# Patient Record
Sex: Female | Born: 1945 | Race: White | Hispanic: No | Marital: Married | Smoking: Current every day smoker
Health system: Southern US, Community
[De-identification: ages and names within clinical notes are randomized; demographics above are authoritative.]

## PROBLEM LIST (undated history)

## (undated) DIAGNOSIS — E119 Type 2 diabetes mellitus without complications: Secondary | ICD-10-CM

## (undated) DIAGNOSIS — E7800 Pure hypercholesterolemia, unspecified: Secondary | ICD-10-CM

## (undated) DIAGNOSIS — I10 Essential (primary) hypertension: Secondary | ICD-10-CM

## (undated) DIAGNOSIS — E079 Disorder of thyroid, unspecified: Secondary | ICD-10-CM

## (undated) DIAGNOSIS — K602 Anal fissure, unspecified: Secondary | ICD-10-CM

## (undated) HISTORY — DX: Disorder of thyroid, unspecified: E07.9

## (undated) HISTORY — DX: Anal fissure, unspecified: K60.2

## (undated) HISTORY — DX: Pure hypercholesterolemia, unspecified: E78.00

## (undated) HISTORY — PX: COLONOSCOPY: SHX174

---

## 1966-02-10 HISTORY — PX: ANAL FISTULOTOMY: SHX6423

## 2000-01-29 ENCOUNTER — Encounter: Payer: Self-pay | Admitting: Emergency Medicine

## 2000-01-29 ENCOUNTER — Inpatient Hospital Stay (HOSPITAL_COMMUNITY): Admission: EM | Admit: 2000-01-29 | Discharge: 2000-01-30 | Payer: Self-pay | Admitting: Emergency Medicine

## 2004-01-13 ENCOUNTER — Emergency Department (HOSPITAL_COMMUNITY): Admission: EM | Admit: 2004-01-13 | Discharge: 2004-01-13 | Payer: Self-pay | Admitting: Emergency Medicine

## 2006-01-03 IMAGING — CR DG THORACIC SPINE 2V
3 series · 3 of 3 positions shown · non-contrast
Comparison: none

CLINICAL DATA: Fall, upper back pain.
 THORACIC SPINE SERIES ? 01/13/04: 
 The thoracic spine is kyphotic.  The bones are demineralized.  There are multilevel degenerative changes.  No fracture or subluxation is seen.

[view not recorded (1 of 3)]
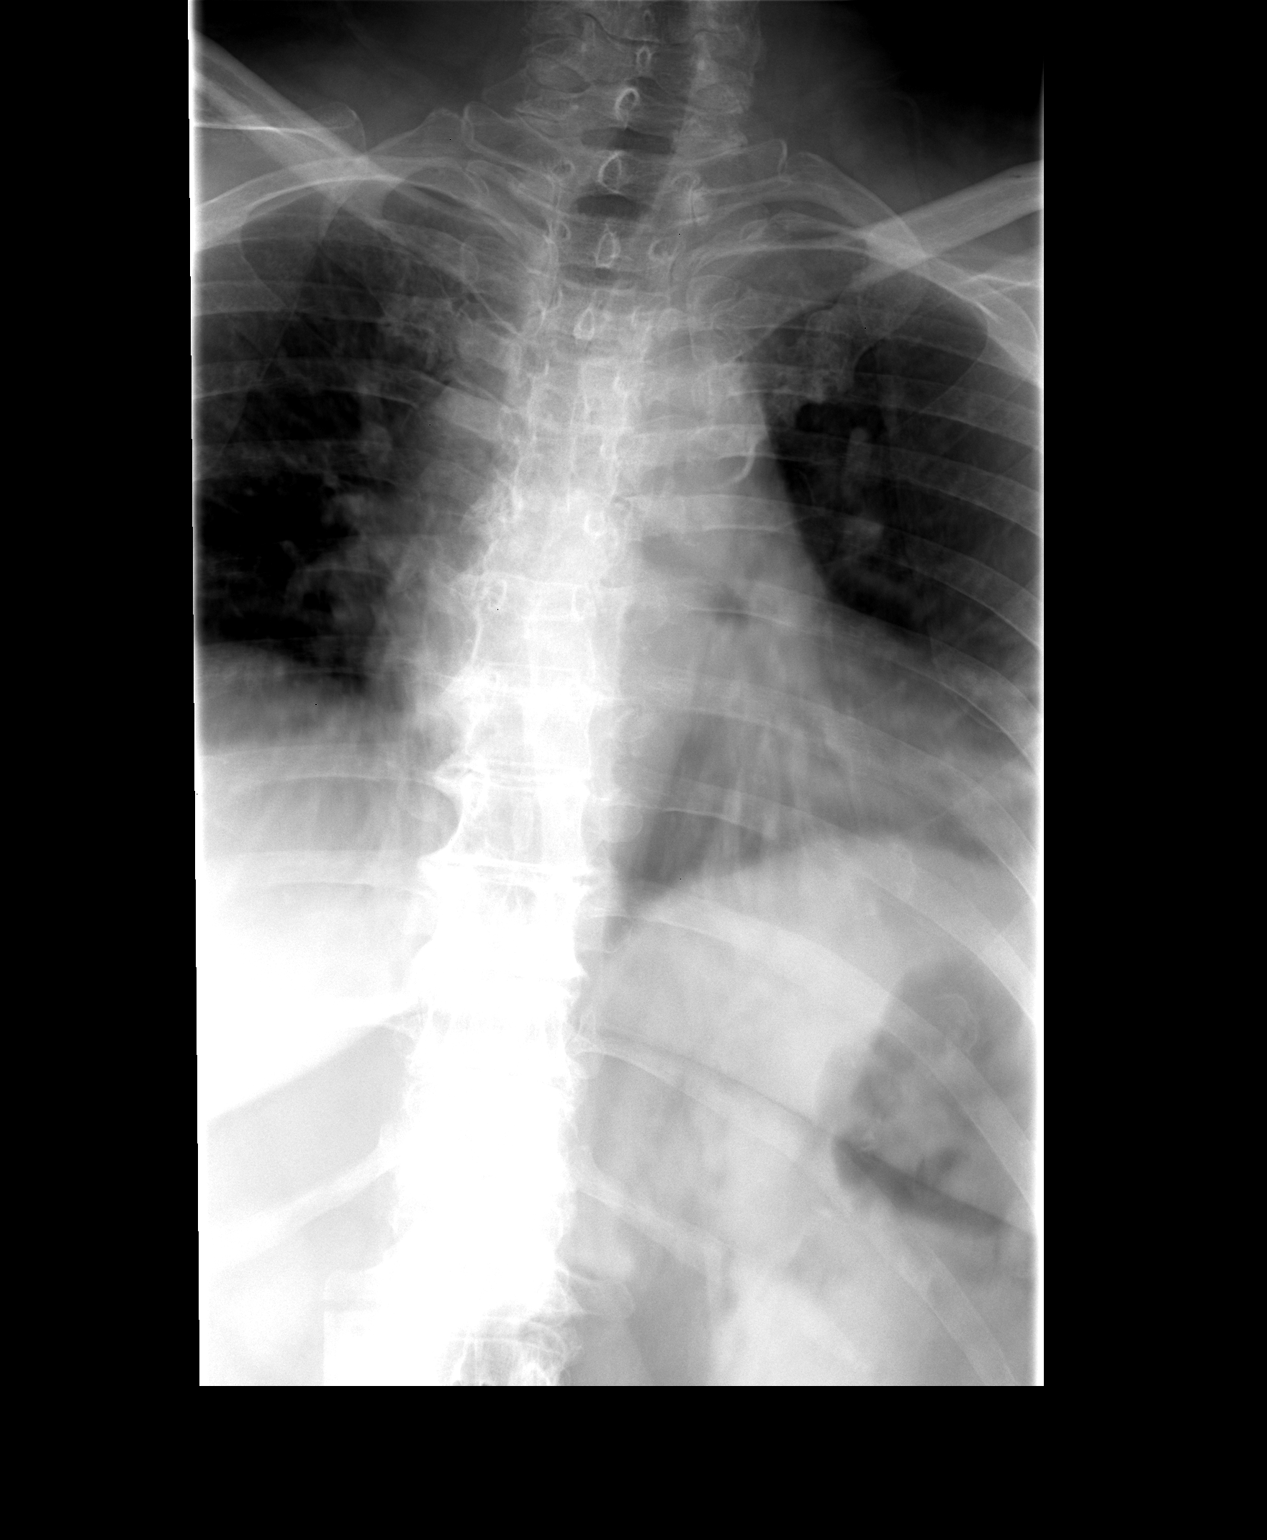

[view not recorded (2 of 3)]
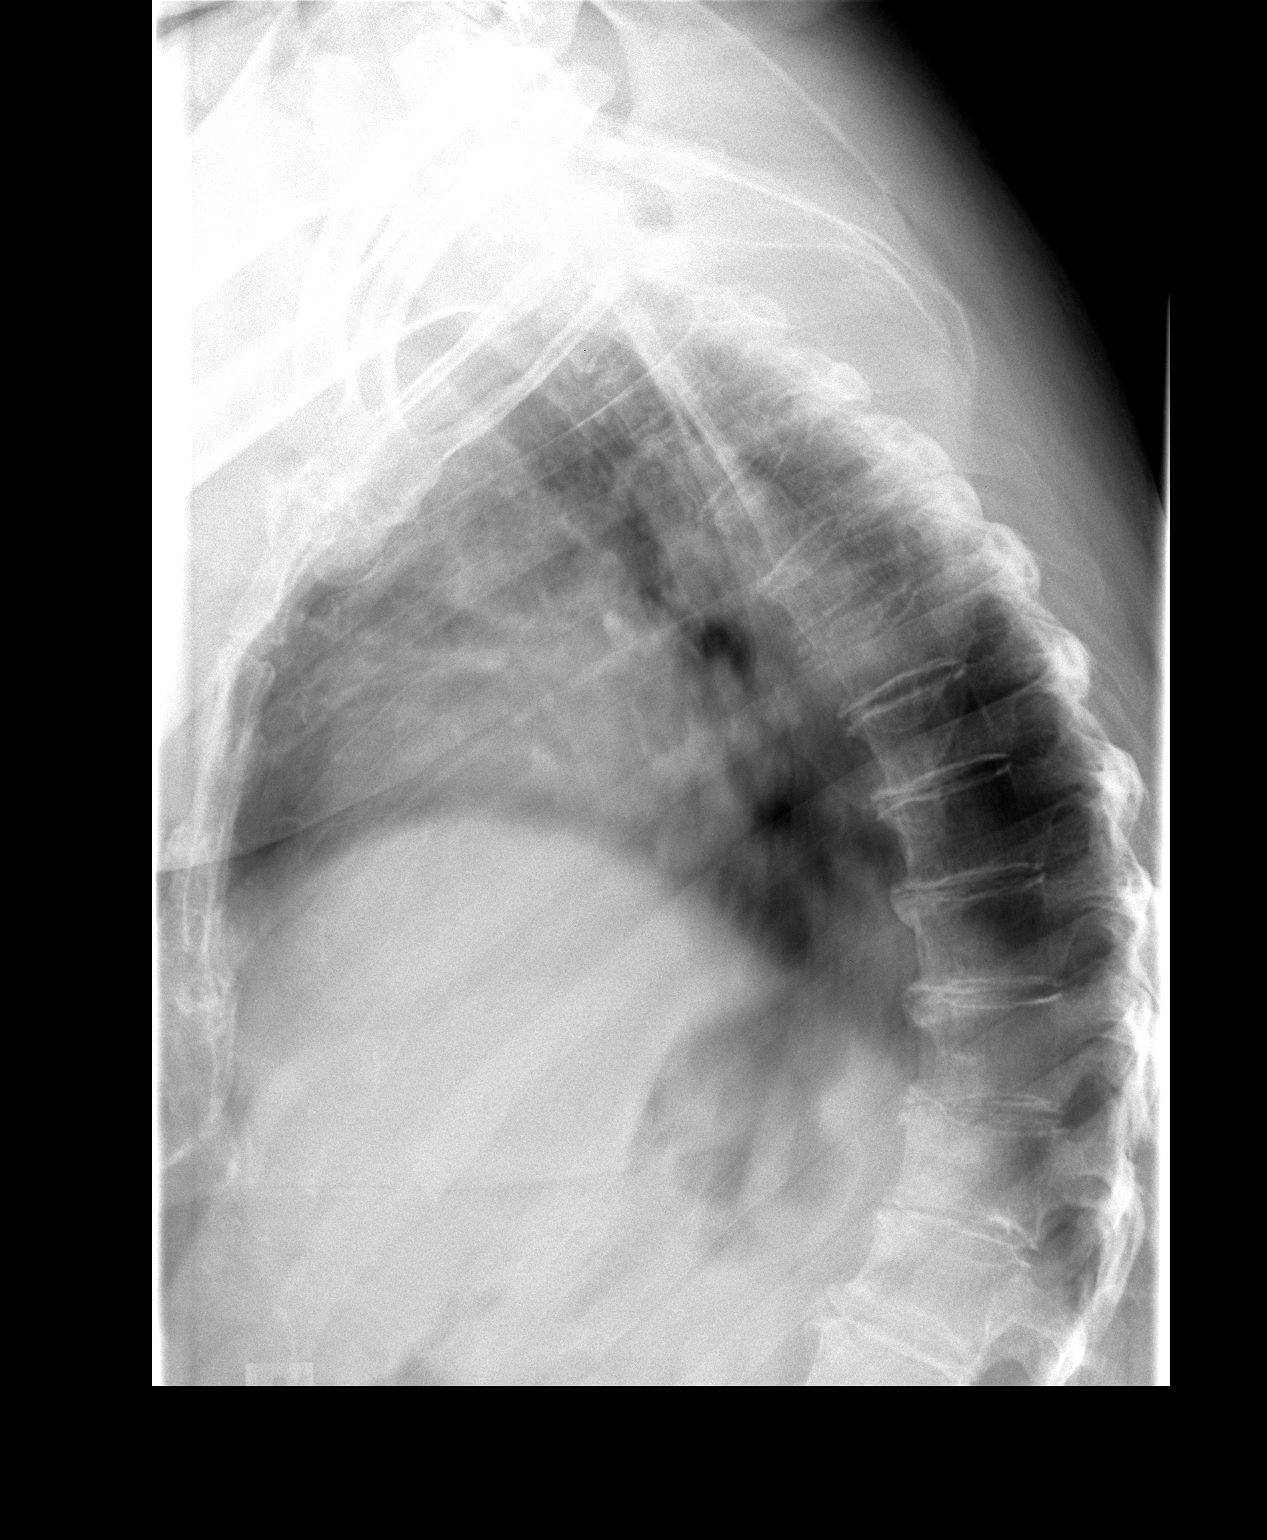

[view not recorded (3 of 3)]
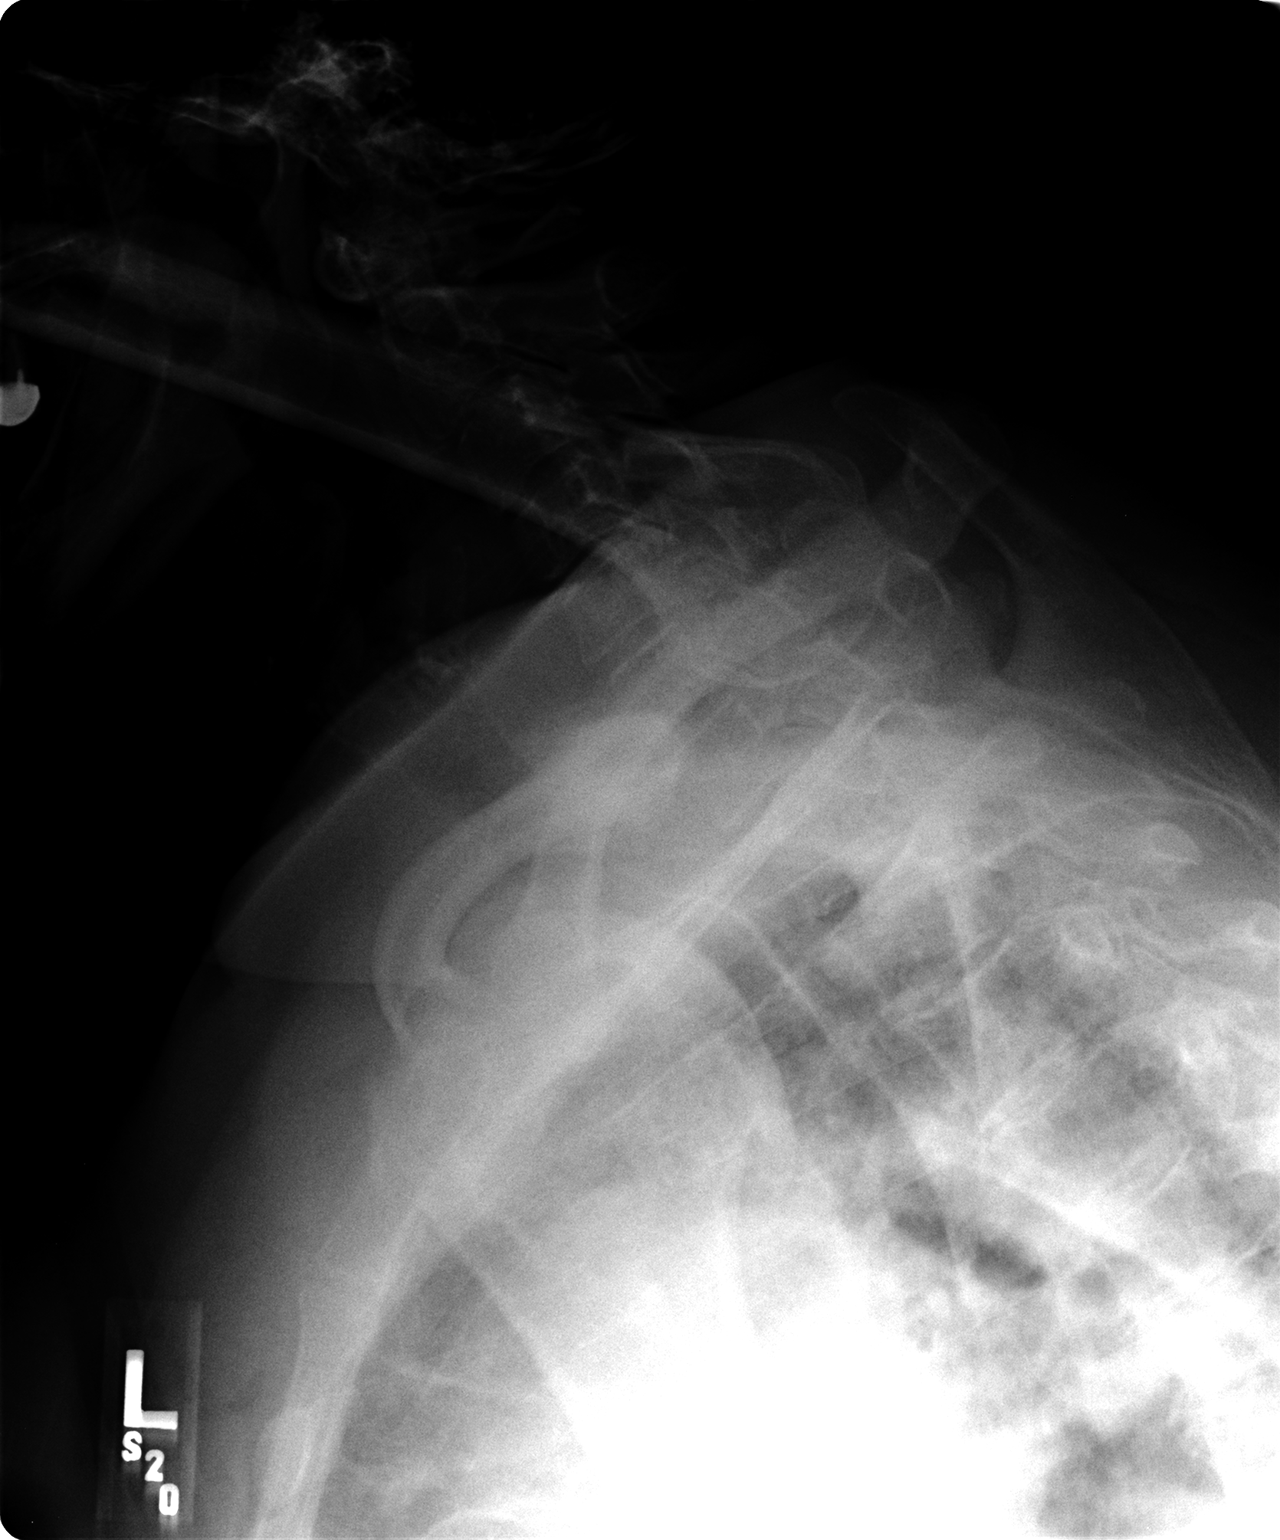

[3 of 3 positions shown; findings below may reference images not displayed]

IMPRESSION: 1.  No convincing evidence for an acute fracture.
 2.  Degenerative changes, thoracic kyphosis, demineralization.

## 2013-02-10 HISTORY — PX: FIXATION KYPHOPLASTY LUMBAR SPINE: SHX1642

## 2015-02-11 HISTORY — PX: CORONARY ARTERY BYPASS GRAFT: SHX141

## 2015-06-28 ENCOUNTER — Other Ambulatory Visit: Payer: Self-pay | Admitting: Physical Medicine and Rehabilitation

## 2015-06-28 DIAGNOSIS — M5136 Other intervertebral disc degeneration, lumbar region: Secondary | ICD-10-CM

## 2015-06-30 ENCOUNTER — Encounter (HOSPITAL_COMMUNITY): Payer: Self-pay

## 2015-06-30 ENCOUNTER — Inpatient Hospital Stay (HOSPITAL_COMMUNITY)
Admission: EM | Admit: 2015-06-30 | Discharge: 2015-07-02 | DRG: 392 | Disposition: A | Discharge status: Home / Self Care | Payer: Medicare Other | Attending: Internal Medicine | Admitting: Internal Medicine

## 2015-06-30 ENCOUNTER — Emergency Department (HOSPITAL_COMMUNITY): Payer: Medicare Other

## 2015-06-30 DIAGNOSIS — N179 Acute kidney failure, unspecified: Secondary | ICD-10-CM | POA: Diagnosis not present

## 2015-06-30 DIAGNOSIS — Z7984 Long term (current) use of oral hypoglycemic drugs: Secondary | ICD-10-CM

## 2015-06-30 DIAGNOSIS — Z6835 Body mass index (BMI) 35.0-35.9, adult: Secondary | ICD-10-CM | POA: Diagnosis not present

## 2015-06-30 DIAGNOSIS — E669 Obesity, unspecified: Secondary | ICD-10-CM | POA: Diagnosis present

## 2015-06-30 DIAGNOSIS — K5732 Diverticulitis of large intestine without perforation or abscess without bleeding: Secondary | ICD-10-CM | POA: Diagnosis present

## 2015-06-30 DIAGNOSIS — I10 Essential (primary) hypertension: Secondary | ICD-10-CM | POA: Diagnosis present

## 2015-06-30 DIAGNOSIS — Z79899 Other long term (current) drug therapy: Secondary | ICD-10-CM | POA: Diagnosis not present

## 2015-06-30 DIAGNOSIS — I959 Hypotension, unspecified: Secondary | ICD-10-CM | POA: Diagnosis present

## 2015-06-30 DIAGNOSIS — K5792 Diverticulitis of intestine, part unspecified, without perforation or abscess without bleeding: Secondary | ICD-10-CM | POA: Diagnosis present

## 2015-06-30 DIAGNOSIS — E119 Type 2 diabetes mellitus without complications: Secondary | ICD-10-CM | POA: Diagnosis present

## 2015-06-30 DIAGNOSIS — R1031 Right lower quadrant pain: Secondary | ICD-10-CM | POA: Diagnosis present

## 2015-06-30 HISTORY — DX: Type 2 diabetes mellitus without complications: E11.9

## 2015-06-30 HISTORY — DX: Essential (primary) hypertension: I10

## 2015-06-30 LAB — COMPREHENSIVE METABOLIC PANEL WITH GFR
ALT: 20 U/L (ref 14–54)
AST: 21 U/L (ref 15–41)
Albumin: 4 g/dL (ref 3.5–5.0)
Alkaline Phosphatase: 76 U/L (ref 38–126)
Anion gap: 15 (ref 5–15)
BUN: 23 mg/dL — ABNORMAL HIGH (ref 6–20)
CO2: 28 mmol/L (ref 22–32)
Calcium: 9.7 mg/dL (ref 8.9–10.3)
Chloride: 95 mmol/L — ABNORMAL LOW (ref 101–111)
Creatinine, Ser: 1.33 mg/dL — ABNORMAL HIGH (ref 0.44–1.00)
GFR calc Af Amer: 46 mL/min — ABNORMAL LOW
GFR calc non Af Amer: 40 mL/min — ABNORMAL LOW
Glucose, Bld: 140 mg/dL — ABNORMAL HIGH (ref 65–99)
Potassium: 3.9 mmol/L (ref 3.5–5.1)
Sodium: 138 mmol/L (ref 135–145)
Total Bilirubin: 0.8 mg/dL (ref 0.3–1.2)
Total Protein: 8.4 g/dL — ABNORMAL HIGH (ref 6.5–8.1)

## 2015-06-30 LAB — CBC
HCT: 42.7 % (ref 36.0–46.0)
Hemoglobin: 14.2 g/dL (ref 12.0–15.0)
MCH: 30.5 pg (ref 26.0–34.0)
MCHC: 33.3 g/dL (ref 30.0–36.0)
MCV: 91.6 fL (ref 78.0–100.0)
Platelets: 339 K/uL (ref 150–400)
RBC: 4.66 MIL/uL (ref 3.87–5.11)
RDW: 13.2 % (ref 11.5–15.5)
WBC: 16.7 K/uL — ABNORMAL HIGH (ref 4.0–10.5)

## 2015-06-30 LAB — GLUCOSE, CAPILLARY
Glucose-Capillary: 84 mg/dL (ref 65–99)
Glucose-Capillary: 82 mg/dL (ref 65–99)

## 2015-06-30 LAB — LIPASE, BLOOD: Lipase: 22 U/L (ref 11–51)

## 2015-06-30 MED ORDER — METRONIDAZOLE IN NACL 5-0.79 MG/ML-% IV SOLN: 500.0000 mg | Freq: Once | INTRAVENOUS | Status: DC

## 2015-06-30 MED ORDER — IOPAMIDOL (ISOVUE-300) INJECTION 61%
INTRAVENOUS | Status: AC
  Administered 2015-06-30: 75 mL
  Filled 2015-06-30: qty 75

## 2015-06-30 MED ORDER — ENOXAPARIN SODIUM 40 MG/0.4ML ~~LOC~~ SOLN
40.0000 mg | SUBCUTANEOUS | Status: DC
  Administered 2015-06-30 – 2015-07-01 (×2): 40 mg via SUBCUTANEOUS
  Filled 2015-06-30 (×2): qty 0.4

## 2015-06-30 MED ORDER — INSULIN ASPART 100 UNIT/ML ~~LOC~~ SOLN: 0.0000 [IU] | SUBCUTANEOUS | Status: DC

## 2015-06-30 MED ORDER — SODIUM CHLORIDE 0.9 % IV BOLUS (SEPSIS)
1000.0000 mL | Freq: Once | INTRAVENOUS | Status: AC
  Administered 2015-06-30: 1000 mL via INTRAVENOUS

## 2015-06-30 MED ORDER — ONDANSETRON HCL 4 MG PO TABS: 4.0000 mg | ORAL_TABLET | Freq: Four times a day (QID) | ORAL | Status: DC | PRN

## 2015-06-30 MED ORDER — CIPROFLOXACIN IN D5W 400 MG/200ML IV SOLN
400.0000 mg | Freq: Once | INTRAVENOUS | Status: AC
  Administered 2015-06-30: 400 mg via INTRAVENOUS
  Filled 2015-06-30: qty 200

## 2015-06-30 MED ORDER — ONDANSETRON HCL 4 MG/2ML IJ SOLN: 4.0000 mg | Freq: Four times a day (QID) | INTRAMUSCULAR | Status: DC | PRN

## 2015-06-30 MED ORDER — FENTANYL CITRATE (PF) 100 MCG/2ML IJ SOLN
50.0000 ug | Freq: Once | INTRAMUSCULAR | Status: AC
  Administered 2015-06-30: 50 ug via INTRAVENOUS
  Filled 2015-06-30: qty 2

## 2015-06-30 MED ORDER — ONDANSETRON HCL 4 MG/2ML IJ SOLN
4.0000 mg | Freq: Once | INTRAMUSCULAR | Status: AC
  Administered 2015-06-30: 4 mg via INTRAVENOUS
  Filled 2015-06-30: qty 2

## 2015-06-30 MED ORDER — METRONIDAZOLE IN NACL 5-0.79 MG/ML-% IV SOLN
500.0000 mg | Freq: Three times a day (TID) | INTRAVENOUS | Status: DC
  Administered 2015-06-30 – 2015-07-02 (×6): 500 mg via INTRAVENOUS
  Filled 2015-06-30 (×9): qty 100

## 2015-06-30 MED ORDER — CIPROFLOXACIN IN D5W 400 MG/200ML IV SOLN
400.0000 mg | Freq: Two times a day (BID) | INTRAVENOUS | Status: DC
  Administered 2015-07-01 – 2015-07-02 (×3): 400 mg via INTRAVENOUS
  Filled 2015-06-30 (×4): qty 200

## 2015-06-30 MED ORDER — POTASSIUM CHLORIDE IN NACL 20-0.9 MEQ/L-% IV SOLN
INTRAVENOUS | Status: DC
  Administered 2015-06-30 – 2015-07-01 (×2): via INTRAVENOUS
  Filled 2015-06-30 (×3): qty 1000

## 2015-06-30 MED ORDER — MORPHINE SULFATE (PF) 2 MG/ML IV SOLN: 1.0000 mg | INTRAVENOUS | Status: DC | PRN

## 2015-06-30 NOTE — H&P (Signed)
Date: 06/30/2015               Patient Name:  Kimberly Bullock MRN: [id number]  DOB: 1945-07-28 Age / Sex: 70 y.o., female   PCP: Hart Carwin, MD         Medical Service: Internal Medicine Teaching Service         Attending Physician: Dr. Tyson Alias, MD    First Contact: Dr. Reubin Milan Pager: [phone]  Second Contact: Dr. Griffin Basil Pager: [phone]       After Hours (After 5p/  First Contact Pager: [phone]  weekends / holidays): Second Contact Pager: [phone]   Chief Complaint: Abdominal pain  History of Present Illness: [name] is a 70 year old with T2DM and HTN who presents with RLQ abdominal pain for the past 3 days. When she was following up at her orthopedic surgeon's office 3 days ago when she started noticing sharp right lower quadrant abdominal pain that would sometimes move to the midline. She had no other symptoms at that time. She does have a history of constipation, so she tried milk of magnesia which improved her pain temporarily, before coming back. After taking the milk of magnesia, she said initially her bowel movements were a "brick" color without obvious blood, but her bowel movements appear normal now. Nothing makes it worse. 2 days ago, she started having subjective fevers at night as well as some mild burning with urination. She denies any nausea or vomiting, abdominal distention, worsening constipation or diarrhea, any urinary frequency or urgency, chest pain, shortness of breath, or palpitations. She denies any recent illness or sick contacts, or any questionable food ingestion. She has never had these symptoms before.   Meds: Current Facility-Administered Medications  Medication Dose Route Frequency Provider Last Rate Last Dose  . 0.9 % NaCl with KCl 20 mEq/ L  infusion   Intravenous Continuous Lora Paula, MD      . ciprofloxacin (CIPRO) IVPB 400 mg  400 mg Intravenous Once Gerhard Munch, MD 200 mL/hr at 06/30/15 1436 400 mg at  06/30/15 1436  . enoxaparin (LOVENOX) injection 40 mg  40 mg Subcutaneous Q24H Lora Paula, MD      . metroNIDAZOLE (FLAGYL) IVPB 500 mg  500 mg Intravenous Once Gerhard Munch, MD      . morphine 2 MG/ML injection 1 mg  1 mg Intravenous Q4H PRN Lora Paula, MD      . ondansetron [facility]) tablet 4 mg  4 mg Oral Q6H PRN Lora Paula, MD       Or  . ondansetron [facility]) injection 4 mg  4 mg Intravenous Q6H PRN Lora Paula, MD       No current outpatient prescriptions on file.    Allergies: Allergies as of 06/30/2015  . (Not on File)   Past Medical History  Diagnosis Date  . Diabetes mellitus without complication (HCC)   . Hypertension    History reviewed. No pertinent past surgical history. No family history on file. Social History   Social History  . Marital Status: Married    Spouse Name: N/A  . Number of Children: N/A  . Years of Education: N/A   Occupational History  . Not on file.   Social History Main Topics  . Smoking status: Never Smoker   . Smokeless tobacco: Not on file  . Alcohol Use: Not on file  . Drug Use: Not on file  . Sexual Activity: Not on file  Other Topics Concern  . Not on file   Social History Narrative  . No narrative on file    Review of Systems: Pertinent items noted in HPI and remainder of comprehensive ROS otherwise negative.  Physical Exam: Blood pressure 113/61, pulse 65, temperature 98.7 F (37.1 C), temperature source Oral, resp. rate 18, weight 176 lb 5.9 oz (80 kg), SpO2 98 %.   Gen: Well-appearing, alert and oriented to person, place, and time  HEENT: Oropharynx clear without erythema or exudate.  Neck: No cervical LAD, no thyromegaly or nodules, no JVD noted. CV: Normal rate, regular rhythm, no murmurs, rubs, or gallops Pulmonary: Normal effort, CTA bilaterally, no crackles or wheezes Abdominal: Soft, obese abdomen, mild tenderness to palpation in the RLQ and suprapubic region, without rebound, guarding,  or hepatosplenomegaly. Murphy's negative, Rovsing's negative. Bowel sounds heard. Extremities: Distal pulses 2+ in upper and lower extremities bilaterally, no tenderness, erythema or edema Neuro: CN II-XII grossly intact, no focal weakness or sensory deficits noted Skin: No atypical appearing moles. No rashes  Lab results: Basic Metabolic Panel:  Recent Labs  81/19/1405/20/17 1022  NA 138  K 3.9  CL 95*  CO2 28  GLUCOSE 140*  BUN 23*  CREATININE 1.33*  CALCIUM 9.7   Liver Function Tests:  Recent Labs  06/30/15 1022  AST 21  ALT 20  ALKPHOS 76  BILITOT 0.8  PROT 8.4*  ALBUMIN 4.0    Recent Labs  06/30/15 1022  LIPASE 22   CBC:  Recent Labs  06/30/15 1022  WBC 16.7*  HGB 14.2  HCT 42.7  MCV 91.6  PLT 339   Imaging results:  Ct Abdomen Pelvis W Contrast  06/30/2015  CLINICAL DATA:  Right lower quadrant pain x3 days EXAM: CT ABDOMEN AND PELVIS WITH CONTRAST TECHNIQUE: Multidetector CT imaging of the abdomen and pelvis was performed using the standard protocol following bolus administration of intravenous contrast. CONTRAST:  75 mL Isovue 300 IV COMPARISON:  Correlation with prior CT abdomen/ pelvis report dated 01/29/2000. Those images are not available at the time of dictation. FINDINGS: Lower chest: Mild patchy bilateral lower lobe opacities, likely atelectasis. Additional linear/plate-like scarring/atelectasis in the right middle lobe, lingula, and bilateral lower lobes. Cardiomegaly.  Coronary atherosclerosis. Hepatobiliary: Liver is within normal limits. No suspicious/enhancing hepatic lesions. Gallbladder is unremarkable. Mild central intrahepatic ductal prominence. Dilated common duct, measuring 19 mm, although smoothly tapering at the ampulla. By report, this measured 12 mm in 2001. Pancreas: Within normal limits. No associated pancreatic ductal dilatation (double duct sign), mass, or atrophy. Spleen: Within normal limits. Adrenals/Urinary Tract: Adrenal glands are  within normal limits. 5 mm probable cyst in the right lower kidney (series 2/ image 42). No hydronephrosis. Bladder is within normal limits. Stomach/Bowel: Stomach is within normal limits. No evidence of bowel obstruction. Normal appendix (series 2/ image 39). Diverticulosis involving the distal descending and proximal sigmoid colon. Associated mild inflammatory changes along the proximal sigmoid colon in the left lower quadrant (series 2/ image 81), suggesting mild sigmoid diverticulitis (series 2/ image 81). No drainable fluid collection/ abscess.  No free air. Vascular/Lymphatic: Atherosclerotic calcifications of the abdominal aorta and branch vessels. No evidence of abdominal aortic aneurysm. No suspicious abdominopelvic lymphadenopathy. Reproductive: Uterus is grossly unremarkable. Bilateral ovaries are within normal limits. Other: No abdominopelvic ascites. No drainable fluid collection/ abscess. No free air to suggest macroscopic perforation. Musculoskeletal: Moderate compression fracture deformity at L3 with prior vertebral augmentation. Vertebroplasty cement in the spinal canal. Degenerative changes of the visualized  thoracolumbar spine. IMPRESSION: Sigmoid diverticulitis. No drainable fluid collection/ abscess. No free air to suggest macroscopic perforation. Dilated common duct, measuring 19 mm, although smoothly tapering at the ampulla. By report, this measured 12 mm in 2001, indicating a chronic process. In the setting of normal LFTs, this is of questionable clinical significance. No evidence of bowel obstruction.  Normal appendix. Additional ancillary findings as above. Electronically Signed   By: Charline Bills M.D.   On: 06/30/2015 14:04   Assessment & Plan by Problem: 1. Acute diverticulitis-patient with 3 days of right lower quadrant and low midline abdominal pain. Also with subjective fevers and possible dysuria for the past 2 days. Patient afebrile here, initially with some hypotension in  the mid 80s systolic that has resolved with IV hydration, not tachycardic. Mild tenderness in the mid and right lower abdomen without peritoneal signs. WBC 17, mild creatinine elevation at 1.33 (no baseline) without other lab abnormalities including a normal lipase. CT abdomen pelvis showing sigmoid diverticulitis without signs of complications such as abscess or perforation. While her abdominal exam is somewhat unexpected for diverticulitis, there are no other obvious signs to suggest a separate process at this time, with dysuria possibly being related to diverticular inflammation itself versus an early UTI. -IV metronidazole and ciprofloxacin -Nothing by mouth for now; advance diet as tolerated -Morphine 1 mg every 4 hours as needed for severe pain -Ondansetron 4 mg every 6 hours when necessary for nausea or vomiting -IV NS with KCl 20 at 100 mL's per hour -Follow-up BMP, CBC, UA with reflex microscopy  2. T2 DM - no A1c in chart. patient unsure of current medication, takes one oral medication -Obtain medication list from husband. -SSI  3. HTN - patient on 2 oral agents at home, unsure of names -Hold BP meds, obtain list as above  Dispo: Disposition is deferred at this time, awaiting improvement of current medical problems. Anticipated discharge in approximately 2-3 day(s).   The patient does have a current PCP Hart Carwin, MD) and does need an [facility] hospital follow-up appointment after discharge.  The patient does not have transportation limitations that hinder transportation to clinic appointments.  Signed: Darrick Huntsman, MD 06/30/2015, 3:29 PM

## 2015-06-30 NOTE — ED Notes (Signed)
Patient here with RLQ abdominal pain x 1 week. Patient felt full and has been taking milk of magnesia and last BM this am. No nausea, no vomiting. Pain worse with movement

## 2015-06-30 NOTE — ED Provider Notes (Addendum)
CSN: [id number]     Arrival date & time 06/30/15  1008 History   First MD Initiated Contact with Patient 06/30/15 1110     Chief Complaint  Patient presents with  . Abdominal Pain   HPI  Patient presents w one week of weakness, anorexia and abd pain.  Pain is sore, moderate, most prominent in the RUQ, but diffuse. There has been no vomiting, though she is nauseous.  No BM changes.  No clear alleviating or exacerbating factors.  Past Medical History  Diagnosis Date  . Diabetes mellitus without complication (HCC)   . Hypertension    History reviewed. No pertinent past surgical history. No family history on file. Social History  Substance Use Topics  . Smoking status: Never Smoker   . Smokeless tobacco: None  . Alcohol Use: None   OB History    No data available     Review of Systems  Constitutional:       Per HPI, otherwise negative  HENT:       Per HPI, otherwise negative  Respiratory:       Per HPI, otherwise negative  Cardiovascular:       Per HPI, otherwise negative  Gastrointestinal: Positive for nausea and abdominal pain. Negative for vomiting.  Endocrine:       Negative aside from HPI  Genitourinary:       Neg aside from HPI   Musculoskeletal:       Per HPI, otherwise negative  Skin: Negative.   Neurological: Negative for syncope.      Allergies  Review of patient's allergies indicates not on file.  Home Medications   Prior to Admission medications   Not on File   BP 99/59 mmHg  Pulse 65  Temp(Src) 98.7 F (37.1 C) (Oral)  Resp 18  Wt 176 lb 5.9 oz (80 kg)  SpO2 96% Physical Exam  Constitutional: She is oriented to person, place, and time. She appears well-developed and well-nourished. No distress.  HENT:  Head: Normocephalic and atraumatic.  Eyes: Conjunctivae and EOM are normal.  Cardiovascular: Normal rate and regular rhythm.   Pulmonary/Chest: Effort normal and breath sounds normal. No stridor. No respiratory distress.  Abdominal: She  exhibits no distension. There is tenderness.  Mild generalized discomfort  Musculoskeletal: She exhibits no edema.  Neurological: She is alert and oriented to person, place, and time. No cranial nerve deficit.  Skin: Skin is warm and dry.  Psychiatric: She has a normal mood and affect.  Nursing note and vitals reviewed.   ED Course  Procedures (including critical care time) Labs Review Labs Reviewed  COMPREHENSIVE METABOLIC PANEL - Abnormal; Notable for the following:    Chloride 95 (*)    Glucose, Bld 140 (*)    BUN 23 (*)    Creatinine, Ser 1.33 (*)    Total Protein 8.4 (*)    GFR calc non Af Amer 40 (*)    GFR calc Af Amer 46 (*)    All other components within normal limits  CBC - Abnormal; Notable for the following:    WBC 16.7 (*)    All other components within normal limits  LIPASE, BLOOD  URINALYSIS, ROUTINE W REFLEX MICROSCOPIC (NOT AT [facility])    Imaging Review Ct Abdomen Pelvis W Contrast  06/30/2015  CLINICAL DATA:  Right lower quadrant pain x3 days EXAM: CT ABDOMEN AND PELVIS WITH CONTRAST TECHNIQUE: Multidetector CT imaging of the abdomen and pelvis was performed using the standard protocol following bolus administration  of intravenous contrast. CONTRAST:  75 mL Isovue 300 IV COMPARISON:  Correlation with prior CT abdomen/ pelvis report dated 01/29/2000. Those images are not available at the time of dictation. FINDINGS: Lower chest: Mild patchy bilateral lower lobe opacities, likely atelectasis. Additional linear/plate-like scarring/atelectasis in the right middle lobe, lingula, and bilateral lower lobes. Cardiomegaly.  Coronary atherosclerosis. Hepatobiliary: Liver is within normal limits. No suspicious/enhancing hepatic lesions. Gallbladder is unremarkable. Mild central intrahepatic ductal prominence. Dilated common duct, measuring 19 mm, although smoothly tapering at the ampulla. By report, this measured 12 mm in 2001. Pancreas: Within normal limits. No associated  pancreatic ductal dilatation (double duct sign), mass, or atrophy. Spleen: Within normal limits. Adrenals/Urinary Tract: Adrenal glands are within normal limits. 5 mm probable cyst in the right lower kidney (series 2/ image 42). No hydronephrosis. Bladder is within normal limits. Stomach/Bowel: Stomach is within normal limits. No evidence of bowel obstruction. Normal appendix (series 2/ image 39). Diverticulosis involving the distal descending and proximal sigmoid colon. Associated mild inflammatory changes along the proximal sigmoid colon in the left lower quadrant (series 2/ image 81), suggesting mild sigmoid diverticulitis (series 2/ image 81). No drainable fluid collection/ abscess.  No free air. Vascular/Lymphatic: Atherosclerotic calcifications of the abdominal aorta and branch vessels. No evidence of abdominal aortic aneurysm. No suspicious abdominopelvic lymphadenopathy. Reproductive: Uterus is grossly unremarkable. Bilateral ovaries are within normal limits. Other: No abdominopelvic ascites. No drainable fluid collection/ abscess. No free air to suggest macroscopic perforation. Musculoskeletal: Moderate compression fracture deformity at L3 with prior vertebral augmentation. Vertebroplasty cement in the spinal canal. Degenerative changes of the visualized thoracolumbar spine. IMPRESSION: Sigmoid diverticulitis. No drainable fluid collection/ abscess. No free air to suggest macroscopic perforation. Dilated common duct, measuring 19 mm, although smoothly tapering at the ampulla. By report, this measured 12 mm in 2001, indicating a chronic process. In the setting of normal LFTs, this is of questionable clinical significance. No evidence of bowel obstruction.  Normal appendix. Additional ancillary findings as above. Electronically Signed   By: Charline Bills M.D.   On: 06/30/2015 14:04   I have personally reviewed and evaluated these images and lab results as part of my medical decision-making.  2:35  PM Patient remains uncomfortable appearing. Blood pressure remains slightly low. Patient has received initial fluid resuscitation. Patient will start antibiotics.   MDM  Oral female presents with new right lower quadrant abdominal pain. Patient is awake, alert, but has substantial pain, guarding in the right lower quadrant of her abdomen. Patient is afebrile, has leukocytosis, and CT evidence of diverticulitis. Given the patient's persistent hypotension after initial fluid resuscitation, she received antibiotics, ongoing fluids, was admitted for further evaluation, management.  Gerhard Munch, MD 06/30/15 1436  Gerhard Munch, MD 07/10/15 [phone]

## 2015-07-01 DIAGNOSIS — Z7984 Long term (current) use of oral hypoglycemic drugs: Secondary | ICD-10-CM

## 2015-07-01 DIAGNOSIS — I10 Essential (primary) hypertension: Secondary | ICD-10-CM

## 2015-07-01 DIAGNOSIS — E119 Type 2 diabetes mellitus without complications: Secondary | ICD-10-CM

## 2015-07-01 DIAGNOSIS — K5792 Diverticulitis of intestine, part unspecified, without perforation or abscess without bleeding: Secondary | ICD-10-CM

## 2015-07-01 LAB — URINALYSIS, ROUTINE W REFLEX MICROSCOPIC
BILIRUBIN URINE: NEGATIVE
Glucose, UA: NEGATIVE mg/dL
Ketones, ur: 15 mg/dL — AB
Leukocytes, UA: NEGATIVE
NITRITE: POSITIVE — AB
PH: 5.5 (ref 5.0–8.0)
Protein, ur: 30 mg/dL — AB

## 2015-07-01 LAB — CBC
HCT: 35.8 % — ABNORMAL LOW (ref 36.0–46.0)
Hemoglobin: 11.5 g/dL — ABNORMAL LOW (ref 12.0–15.0)
MCH: 30.3 pg (ref 26.0–34.0)
MCHC: 32.1 g/dL (ref 30.0–36.0)
MCV: 94.2 fL (ref 78.0–100.0)
PLATELETS: 296 10*3/uL (ref 150–400)
RBC: 3.8 MIL/uL — ABNORMAL LOW (ref 3.87–5.11)
RDW: 13.4 % (ref 11.5–15.5)
WBC: 10.4 10*3/uL (ref 4.0–10.5)

## 2015-07-01 LAB — BASIC METABOLIC PANEL
Anion gap: 9 (ref 5–15)
BUN: 21 mg/dL — AB (ref 6–20)
CALCIUM: 8 mg/dL — AB (ref 8.9–10.3)
CO2: 26 mmol/L (ref 22–32)
CREATININE: 0.99 mg/dL (ref 0.44–1.00)
Chloride: 104 mmol/L (ref 101–111)
GFR calc Af Amer: >60 mL/min (ref >60)
GFR, EST NON AFRICAN AMERICAN: 57 mL/min — AB (ref >60)
Glucose, Bld: 99 mg/dL (ref 65–99)
Potassium: 3.8 mmol/L (ref 3.5–5.1)
SODIUM: 139 mmol/L (ref 135–145)

## 2015-07-01 LAB — GLUCOSE, CAPILLARY
GLUCOSE-CAPILLARY: 83 mg/dL (ref 65–99)
Glucose-Capillary: 103 mg/dL — ABNORMAL HIGH (ref 65–99)
Glucose-Capillary: 108 mg/dL — ABNORMAL HIGH (ref 65–99)
Glucose-Capillary: 110 mg/dL — ABNORMAL HIGH (ref 65–99)
Glucose-Capillary: 75 mg/dL (ref 65–99)
Glucose-Capillary: 89 mg/dL (ref 65–99)
Glucose-Capillary: 89 mg/dL (ref 65–99)

## 2015-07-01 LAB — URINE MICROSCOPIC-ADD ON

## 2015-07-01 MED ORDER — CETYLPYRIDINIUM CHLORIDE 0.05 % MT LIQD
7.0000 mL | Freq: Two times a day (BID) | OROMUCOSAL | Status: DC
  Administered 2015-07-01 (×2): 7 mL via OROMUCOSAL

## 2015-07-01 MED ORDER — KCL IN DEXTROSE-NACL 20-5-0.9 MEQ/L-%-% IV SOLN
INTRAVENOUS | Status: AC
  Administered 2015-07-01: 20:00:00 via INTRAVENOUS
  Filled 2015-07-01 (×3): qty 1000

## 2015-07-01 MED ORDER — ACETAMINOPHEN 500 MG PO TABS: 500.0000 mg | ORAL_TABLET | Freq: Four times a day (QID) | ORAL | Status: DC | PRN

## 2015-07-01 NOTE — Progress Notes (Signed)
   Subjective: [name] had NAEON. This morning, she still has lower right sided abdominal pain that is about the same as yesterday afternoon. However, she has not received any pain medication since admission. She denies fevers, N/V/D, or other symptoms at this time.  Objective: Vital signs in last 24 hours: Filed Vitals:   06/30/15 1603 06/30/15 2024 07/01/15 0349 07/01/15 0500  BP: 125/60 112/58 100/59   Pulse: 66 68 70   Temp: 98.3 F (36.8 C) 98.3 F (36.8 C) 98.5 F (36.9 C)   TempSrc: Oral Oral Oral   Resp: 18 19 19    Weight:    188 lb 11.2 oz (85.594 kg)  SpO2:  96% 94%     Gen: Well-appearing, alert and oriented to person, place, and time  CV: Normal rate, regular rhythm, no murmurs, rubs, or gallops Pulmonary: Normal effort, CTA bilaterally, no crackles or wheezes Abdominal: Soft, obese abdomen, mild tenderness to palpation in the RLQ and suprapubic region, without rebound, guarding, or hepatosplenomegaly. Murphy's negative, Rovsing's negative. Bowel sounds heard. Extremities: Distal pulses 2+ in upper and lower extremities bilaterally, no tenderness, erythema or edema  Lab Results: Basic Metabolic Panel:  Recent Labs Lab 06/30/15 1022 07/01/15 0549  NA 138 139  K 3.9 3.8  CL 95* 104  CO2 28 26  GLUCOSE 140* 99  BUN 23* 21*  CREATININE 1.33* 0.99  CALCIUM 9.7 8.0*   CBC:  Recent Labs Lab 06/30/15 1022 07/01/15 0549  WBC 16.7* 10.4  HGB 14.2 11.5*  HCT 42.7 35.8*  MCV 91.6 94.2  PLT 339 296   Urinalysis:  Recent Labs Lab 06/30/15 2316  COLORURINE YELLOW  LABSPEC >1.046*  PHURINE 5.5  GLUCOSEU NEGATIVE  HGBUR TRACE*  BILIRUBINUR NEGATIVE  KETONESUR 15*  PROTEINUR 30*  NITRITE POSITIVE*  LEUKOCYTESUR NEGATIVE   Assessment/Plan: 1. Acute diverticulitis - patient with 3 days of right lower quadrant and low midline abdominal pain. Also with subjective fevers and possible dysuria for the past 2 days. Patient afebrile here, initially with  some hypotension in the mid 80s systolic that has resolved with IV hydration, not tachycardic. Normal lipase and LFTs. Leukocytosis and AKI have both resolved. CT abdomen pelvis showing sigmoid diverticulitis without signs of complications such as abscess or perforation. Dysuria with nitrite positive bactiuria UA, likely seeded from direct extension of confirmed diverticulitis. -IV metronidazole and ciprofloxacin. Will transition to PO as she advances -Advance to clears -Morphine 1 mg every 4 hours as needed for severe pain. Will add on oral acetaminophen as well.  -Ondansetron 4 mg every 6 hours when necessary for nausea or vomiting -IV NS with KCl 20 at 100 mL's per hour -Follow-up BMP, CBC  2. T2 DM - no A1c in chart. Takes metformin 850mg  -SSI  3. HTN - patient on bisoprolol 5mg  and telmisartan/HCTZ 80/12.5 at home -Hold BP meds acutely  Dispo: Disposition is deferred at this time, awaiting improvement of current medical problems.  Anticipated discharge in approximately 1-3 day(s).   The patient does have a current PCP Kimberly Bullock(Kimberly M Brodie, MD) and [facility].  The patient does not have transportation limitations that hinder transportation to clinic appointments.  LOS: 1 day   Darrick HuntsmanWilliam R Dequavious Harshberger, MD 07/01/2015, 10:36 AM

## 2015-07-02 LAB — HEMOGLOBIN A1C
HEMOGLOBIN A1C: 6.2 % — AB (ref 4.8–5.6)
MEAN PLASMA GLUCOSE: 131 mg/dL

## 2015-07-02 LAB — GLUCOSE, CAPILLARY
Glucose-Capillary: 129 mg/dL — ABNORMAL HIGH (ref 65–99)
Glucose-Capillary: 138 mg/dL — ABNORMAL HIGH (ref 65–99)
Glucose-Capillary: 201 mg/dL — ABNORMAL HIGH (ref 65–99)
Glucose-Capillary: 97 mg/dL (ref 65–99)

## 2015-07-02 MED ORDER — INSULIN ASPART 100 UNIT/ML ~~LOC~~ SOLN
0.0000 [IU] | Freq: Three times a day (TID) | SUBCUTANEOUS | Status: DC
  Administered 2015-07-02: 3 [IU] via SUBCUTANEOUS

## 2015-07-02 MED ORDER — METRONIDAZOLE 500 MG PO TABS: 500.0000 mg | ORAL_TABLET | Freq: Three times a day (TID) | ORAL | Status: DC

## 2015-07-02 MED ORDER — KCL IN DEXTROSE-NACL 20-5-0.9 MEQ/L-%-% IV SOLN
INTRAVENOUS | Status: DC
  Administered 2015-07-02: 09:00:00 via INTRAVENOUS
  Filled 2015-07-02 (×2): qty 1000

## 2015-07-02 MED ORDER — CIPROFLOXACIN HCL 500 MG PO TABS: 500.0000 mg | ORAL_TABLET | Freq: Two times a day (BID) | ORAL | Status: DC

## 2015-07-02 NOTE — Discharge Instructions (Addendum)
Kimberly Bullock,  I am happy you are feeling better. The stomach infection you had is called diverticulitis, and it is getting much better and should resolve with the antibiotics. Please take the two antibiotics as instructed for the next 7 days (metronidazole 1 pill three times per day - one in the morning, one at noon, and one in the evening AND ciprofloxacin 1 pill twice per day - one in the morning and one in the evening)   Complete the course of antibiotics even if you are feeling better before then.  You can take acetaminophen as needed for stomach pain.  Finally, I have added some instructions on what to eat.  For the next few days, keep eating mostly soft foods and soups/broth. Once you are done with the antibiotics, try and eat foods that are high in fiber. This will help you poop more frequently and prevent you from having diverticulitis again.  Foods to NOT eat over the next few days are: seeds, corn, and nuts  However, you can eat these foods once you are done with the antibiotics in moderation. It is more important to eat lots of fiber than it is to avoid seeds, corn, and nuts.  You have my card - call our clinic at [phone] to make a follow-up appointment once you return from [place].  Thanks, Kimberly MilanBilly Jomayra Bullock  High-Fiber Diet Fiber, also called dietary fiber, is a type of carbohydrate found in fruits, vegetables, whole grains, and beans. A high-fiber diet can have many health benefits. Your health care provider may recommend a high-fiber diet to help:  Prevent constipation. Fiber can make your bowel movements more regular.  Lower your cholesterol.  Relieve hemorrhoids, uncomplicated diverticulosis, or irritable bowel syndrome.  Prevent overeating as part of a weight-loss plan.  Prevent heart disease, type 2 diabetes, and certain cancers.   WHAT IS MY PLAN? The recommended daily intake of fiber includes:  21 grams for women over age 69.   You can get the  recommended daily intake of dietary fiber by eating a variety of fruits, vegetables, grains, and beans. Your health care provider may also recommend a fiber supplement if it is not possible to get enough fiber through your diet.  WHAT DO I NEED TO KNOW ABOUT A HIGH-FIBER DIET?  Fiber supplements have not been widely studied for their effectiveness, so it is better to get fiber through food sources.  Always check the fiber content on thenutrition facts label of any prepackaged food. Look for foods that contain at least 5 grams of fiber per serving.  Ask your dietitian if you have questions about specific foods that are related to your condition, especially if those foods are not listed in the following section.  Increase your daily fiber consumption gradually. Increasing your intake of dietary fiber too quickly may cause bloating, cramping, or gas.  Drink plenty of water. Water helps you to digest fiber. WHAT FOODS CAN I EAT? Grains Whole-grain breads. Multigrain cereal. Oats and oatmeal. Brown rice. Barley. Bulgur wheat. Millet. Bran muffins. Popcorn. Rye wafer crackers. Vegetables Sweet potatoes. Spinach. Kale. Artichokes. Cabbage. Broccoli. Green peas. Carrots. Squash. Fruits Berries. Pears. Apples. Oranges. Avocados. Prunes and raisins. Dried figs. Meats and Other Protein Sources Navy, kidney, pinto, and soy beans. Split peas. Lentils. Nuts and seeds. Dairy Fiber-fortified yogurt. Beverages Fiber-fortified soy milk. Fiber-fortified orange juice. Other Fiber bars. The items listed above may not be a complete list of recommended foods or beverages. Contact your dietitian for more options. WHAT  FOODS ARE NOT RECOMMENDED? Grains White bread. Pasta made with refined flour. White rice. Vegetables Fried potatoes. Canned vegetables. Well-cooked vegetables.  Fruits Fruit juice. Cooked, strained fruit. Meats and Other Protein Sources Fatty cuts of meat. Fried Environmental education officer or fried  fish. Dairy Milk. Yogurt. Cream cheese. Sour cream. Beverages Soft drinks. Other Cakes and pastries. Butter and oils. The items listed above may not be a complete list of foods and beverages to avoid. Contact your dietitian for more information. WHAT ARE SOME TIPS FOR INCLUDING HIGH-FIBER FOODS IN MY DIET?  Eat a wide variety of high-fiber foods.  Make sure that half of all grains consumed each day are whole grains.  Replace breads and cereals made from refined flour or white flour with whole-grain breads and cereals.  Replace white rice with brown rice, bulgur wheat, or millet.  Start the day with a breakfast that is high in fiber, such as a cereal that contains at least 5 grams of fiber per serving.  Use beans in place of meat in soups, salads, or pasta.  Eat high-fiber snacks, such as berries, raw vegetables, nuts, or popcorn.   This information is not intended to replace advice given to you by your health care provider. Make sure you discuss any questions you have with your health care provider.   Document Released: 01/27/2005 Document Revised: 02/17/2014 Document Reviewed: 07/12/2013 Elsevier Interactive Patient Education [employer]

## 2015-07-02 NOTE — Progress Notes (Signed)
   Subjective: [name] had NAEON. This morning, her lower right sided abdominal pain that is much improved from the last 2 days and is only mildly sore. She still has not received any pain medication since admission. She denies fevers, N/V/D, or other symptoms at this time. She tolerated breakfast well without issue.  Objective: Vital signs in last 24 hours: Filed Vitals:   07/01/15 1344 07/01/15 2000 07/01/15 2016 07/02/15 0408  BP: 119/61  135/66 139/63  Pulse: 77  68 66  Temp: 98.2 F (36.8 C)  98.5 F (36.9 C) 97.5 F (36.4 C)  TempSrc: Oral  Oral Oral  Resp: 16  18 19   Height:  5' 1.42" (1.56 m)    Weight:    188 lb 7.7 oz (85.493 kg)  SpO2: 95%  95% 95%    Gen: Well-appearing, alert and oriented to person, place, and time  CV: Normal rate, regular rhythm, no murmurs, rubs, or gallops Pulmonary: Normal effort, CTA bilaterally, no crackles or wheezes Abdominal: Soft, obese abdomen, mild tenderness to palpation in the RLQ only, without rebound, guarding, or hepatosplenomegaly. Murphy's negative, Rovsing's negative. Bowel sounds heard. Extremities: Distal pulses 2+ in upper and lower extremities bilaterally, no tenderness, erythema or edema  Lab Results: Basic Metabolic Panel:  Recent Labs Lab 06/30/15 1022 07/01/15 0549  NA 138 139  K 3.9 3.8  CL 95* 104  CO2 28 26  GLUCOSE 140* 99  BUN 23* 21*  CREATININE 1.33* 0.99  CALCIUM 9.7 8.0*   CBC:  Recent Labs Lab 06/30/15 1022 07/01/15 0549  WBC 16.7* 10.4  HGB 14.2 11.5*  HCT 42.7 35.8*  MCV 91.6 94.2  PLT 339 296   Urinalysis:  Recent Labs Lab 06/30/15 2316  COLORURINE YELLOW  LABSPEC >1.046*  PHURINE 5.5  GLUCOSEU NEGATIVE  HGBUR TRACE*  BILIRUBINUR NEGATIVE  KETONESUR 15*  PROTEINUR 30*  NITRITE POSITIVE*  LEUKOCYTESUR NEGATIVE   Assessment/Plan: 1. Acute diverticulitis - patient with 3 days of right lower quadrant and low midline abdominal pain. Also with subjective fevers and possible  dysuria for the past 2 days. Patient afebrile here, initially with some hypotension in the mid 80s systolic that has resolved with IV hydration, not tachycardic. Normal lipase and LFTs. Leukocytosis and AKI have both resolved. CT abdomen pelvis showing sigmoid diverticulitis without signs of complications such as abscess or perforation. Dysuria with nitrite positive bactiuria UA, likely seeded from direct extension of confirmed diverticulitis. -IV metronidazole and ciprofloxacin. Will transition to PO today and send home on a 10 day course total. -Advance diet as tolerated -Morphine 1 mg every 4 hours as needed for severe pain,  acetaminophen PO as well.  -Ondansetron 4 mg every 6 hours when necessary for nausea or vomiting -IV NS with KCl 20 at 100 mL's per hour -Follow-up BMP, CBC  2. T2 DM - no A1c in chart. Takes metformin 850mg  -SSI  3. HTN - patient on bisoprolol 5mg  and telmisartan/HCTZ 80/12.5 at home -Hold BP meds acutely  Dispo: Disposition is deferred at this time, awaiting improvement of current medical problems.  Anticipated discharge today.  The patient does have a current PCP Kimberly Bullock(Kimberly M Brodie, MD) and [facility] after discharge.  The patient does not have transportation limitations that hinder transportation to clinic appointments.  LOS: 2 days   Darrick HuntsmanWilliam R Raena Pau, MD 07/02/2015, 11:12 AM

## 2015-07-02 NOTE — Progress Notes (Signed)
Corrections : Offered pt. A bath but pt. Refused. Pt. Stated she believes she is going home today so she does not need a bath. Also refused linen change

## 2015-07-02 NOTE — Progress Notes (Signed)
Pt discharged home with her husband in stable condition. Both pt and her husband verbalized understanding the discharge instructions provided.

## 2015-07-03 NOTE — Discharge Summary (Signed)
Name: Kimberly Bullock MRN: [id number] DOB: [birth year] 70 y.o. PCP: Hart Carwin, MD  Date of Admission: 06/30/2015 11:09 AM Date of Discharge: 07/03/2015 Attending Physician: No att. providers found  Discharge Diagnosis: 1. Acute uncomplicated diverticulitis  Active Problems:   Acute diverticulitis  Discharge Medications:   Medication List    TAKE these medications        ciprofloxacin 500 MG tablet  Commonly known as:  CIPRO  Take 1 tablet (500 mg total) by mouth 2 (two) times daily.     metroNIDAZOLE 500 MG tablet  Commonly known as:  FLAGYL  Take 1 tablet (500 mg total) by mouth 3 (three) times daily. Do not drink alcohol while taking this medication     OVER THE COUNTER MEDICATION  Take 1 tablet by mouth daily as needed (pain/headache). Analgen (similar to tylenol) from [place]     PRESCRIPTION MEDICATION  Take 0.5 tablets by mouth daily. MicardisPlus 80-12.5 from [place]     PRESCRIPTION MEDICATION  Take 850 mg by mouth 2 (two) times daily. Metfogamma (metformin) from [place]     PRESCRIPTION MEDICATION  Take 2.5 mg by mouth daily. Bisogamma (bisoprolol) from [place]        Disposition and follow-up:   Kimberly Bullock was discharged from [facility] in Good condition.  At the hospital follow up visit please address:  1.  Has she had any recurrence of abdominal pain? Is she eating a high-fiber diet? How was her trip to [place]  2.  Labs / imaging needed at time of follow-up: Consider ordering repeat colonoscopy (at least 6-8 weeks post discharge) given this is her first episode of diverticulitis?  3.  Pending labs/ test needing follow-up: A1c  Follow-up Appointments:     Follow-up Information    Schedule an appointment as soon as possible for a visit with [facility].   Why:  For hospital follow up and to establish care in our clinic   Contact information:   [address] [place]  [place] [address] [phone]      Discharge Instructions: Discharge Instructions    Increase activity slowly    Complete by:  As directed            Consultations:    Procedures Performed:  Ct Abdomen Pelvis W Contrast  06/30/2015  CLINICAL DATA:  Right lower quadrant pain x3 days EXAM: CT ABDOMEN AND PELVIS WITH CONTRAST TECHNIQUE: Multidetector CT imaging of the abdomen and pelvis was performed using the standard protocol following bolus administration of intravenous contrast. CONTRAST:  75 mL Isovue 300 IV COMPARISON:  Correlation with prior CT abdomen/ pelvis report dated 01/29/2000. Those images are not available at the time of dictation. FINDINGS: Lower chest: Mild patchy bilateral lower lobe opacities, likely atelectasis. Additional linear/plate-like scarring/atelectasis in the right middle lobe, lingula, and bilateral lower lobes. Cardiomegaly.  Coronary atherosclerosis. Hepatobiliary: Liver is within normal limits. No suspicious/enhancing hepatic lesions. Gallbladder is unremarkable. Mild central intrahepatic ductal prominence. Dilated common duct, measuring 19 mm, although smoothly tapering at the ampulla. By report, this measured 12 mm in 2001. Pancreas: Within normal limits. No associated pancreatic ductal dilatation (double duct sign), mass, or atrophy. Spleen: Within normal limits. Adrenals/Urinary Tract: Adrenal glands are within normal limits. 5 mm probable cyst in the right lower kidney (series 2/ image 42). No hydronephrosis. Bladder is within normal limits. Stomach/Bowel: Stomach is within normal limits. No evidence of bowel obstruction. Normal appendix (series 2/ image 39). Diverticulosis  involving the distal descending and proximal sigmoid colon. Associated mild inflammatory changes along the proximal sigmoid colon in the left lower quadrant (series 2/ image 81), suggesting mild sigmoid diverticulitis (series 2/ image 81). No drainable fluid collection/ abscess.  No free air.  Vascular/Lymphatic: Atherosclerotic calcifications of the abdominal aorta and branch vessels. No evidence of abdominal aortic aneurysm. No suspicious abdominopelvic lymphadenopathy. Reproductive: Uterus is grossly unremarkable. Bilateral ovaries are within normal limits. Other: No abdominopelvic ascites. No drainable fluid collection/ abscess. No free air to suggest macroscopic perforation. Musculoskeletal: Moderate compression fracture deformity at L3 with prior vertebral augmentation. Vertebroplasty cement in the spinal canal. Degenerative changes of the visualized thoracolumbar spine. IMPRESSION: Sigmoid diverticulitis. No drainable fluid collection/ abscess. No free air to suggest macroscopic perforation. Dilated common duct, measuring 19 mm, although smoothly tapering at the ampulla. By report, this measured 12 mm in 2001, indicating a chronic process. In the setting of normal LFTs, this is of questionable clinical significance. No evidence of bowel obstruction.  Normal appendix. Additional ancillary findings as above. Electronically Signed   By: Charline BillsSriyesh  Krishnan M.D.   On: 06/30/2015 14:04    2D Echo: None  Cardiac Cath: None  Admission HPI: Kimberly Bullock is a 70 year old with T2DM and HTN who presents with RLQ abdominal pain for the past 3 days. When she was following up at her orthopedic surgeon's office 3 days ago when she started noticing sharp right lower quadrant abdominal pain that would sometimes move to the midline. She had no other symptoms at that time. She does have a history of constipation, so she tried milk of magnesia which improved her pain temporarily, before coming back. After taking the milk of magnesia, she said initially her bowel movements were a "brick" color without obvious blood, but her bowel movements appear normal now. Nothing makes it worse. 2 days ago, she started having subjective fevers at night as well as some mild burning with urination. She denies any nausea or  vomiting, abdominal distention, worsening constipation or diarrhea, any urinary frequency or urgency, chest pain, shortness of breath, or palpitations. She denies any recent illness or sick contacts, or any questionable food ingestion. She has never had these symptoms before.   Hospital Course by problem list: Active Problems:   Acute diverticulitis   1. Acute uncomplicated diverticulitis - CT showed diverticulitis without abscess or perforation. She did have BPs into the high 80s in the ED without tachycardia or other vital abnormalities, that responded immediately to IV hydration. She was started on ciprofloxacin and metronidazole. She remained afebrile and her leukocytosis resolved overnight. She had mild/moderate abdominal pain that resolved overnight without her using analgesics outside the ED. Her diet was advanced, she was transitioned to cipro and metronidazole PO for 10 days total, with instructions to make a follow-up appointment in our clinic to establish care (her old PCP retired) when she returns from visiting family in [place] (she's unsure when she's returning).  Discharge Vitals:   BP 139/63 mmHg  Pulse 66  Temp(Src) 97.5 F (36.4 C) (Oral)  Resp 19  Ht 5' 1.42" (1.56 m)  Wt 188 lb 7.7 oz (85.493 kg)  BMI 35.13 kg/m2  SpO2 95%  Discharge Labs:  Results for orders placed or performed during the hospital encounter of 06/30/15 (from the past 24 hour(s))  Glucose, capillary     Status: Abnormal   Collection Time: 07/02/15  8:04 AM  Result Value Ref Range   Glucose-Capillary 138 (H) 65 - 99 mg/dL  Comment 1 Notify RN   Glucose, capillary     Status: Abnormal   Collection Time: 07/02/15  8:39 AM  Result Value Ref Range   Glucose-Capillary 201 (H) 65 - 99 mg/dL  Glucose, capillary     Status: None   Collection Time: 07/02/15 11:46 AM  Result Value Ref Range   Glucose-Capillary 97 65 - 99 mg/dL   Signed: Darrick Huntsman, MD 07/03/2015, 7:26 AM    Services Ordered on  Discharge: None Equipment Ordered on Discharge: None

## 2020-02-17 DIAGNOSIS — E039 Hypothyroidism, unspecified: Secondary | ICD-10-CM | POA: Diagnosis not present

## 2020-02-17 DIAGNOSIS — E669 Obesity, unspecified: Secondary | ICD-10-CM | POA: Diagnosis not present

## 2020-02-17 DIAGNOSIS — I2510 Atherosclerotic heart disease of native coronary artery without angina pectoris: Secondary | ICD-10-CM | POA: Diagnosis not present

## 2020-02-17 DIAGNOSIS — E1159 Type 2 diabetes mellitus with other circulatory complications: Secondary | ICD-10-CM | POA: Diagnosis not present

## 2020-02-17 DIAGNOSIS — E7800 Pure hypercholesterolemia, unspecified: Secondary | ICD-10-CM | POA: Diagnosis not present

## 2020-02-17 DIAGNOSIS — E1122 Type 2 diabetes mellitus with diabetic chronic kidney disease: Secondary | ICD-10-CM | POA: Diagnosis not present

## 2020-02-17 DIAGNOSIS — I10 Essential (primary) hypertension: Secondary | ICD-10-CM | POA: Diagnosis not present

## 2020-02-24 DIAGNOSIS — I129 Hypertensive chronic kidney disease with stage 1 through stage 4 chronic kidney disease, or unspecified chronic kidney disease: Secondary | ICD-10-CM | POA: Diagnosis not present

## 2020-02-24 DIAGNOSIS — E1122 Type 2 diabetes mellitus with diabetic chronic kidney disease: Secondary | ICD-10-CM | POA: Diagnosis not present

## 2020-02-24 DIAGNOSIS — E039 Hypothyroidism, unspecified: Secondary | ICD-10-CM | POA: Diagnosis not present

## 2020-02-24 DIAGNOSIS — I2510 Atherosclerotic heart disease of native coronary artery without angina pectoris: Secondary | ICD-10-CM | POA: Diagnosis not present

## 2020-02-24 DIAGNOSIS — E7800 Pure hypercholesterolemia, unspecified: Secondary | ICD-10-CM | POA: Diagnosis not present

## 2020-06-01 DIAGNOSIS — I10 Essential (primary) hypertension: Secondary | ICD-10-CM | POA: Diagnosis not present

## 2020-06-01 DIAGNOSIS — E7800 Pure hypercholesterolemia, unspecified: Secondary | ICD-10-CM | POA: Diagnosis not present

## 2020-06-01 DIAGNOSIS — E039 Hypothyroidism, unspecified: Secondary | ICD-10-CM | POA: Diagnosis not present

## 2020-06-01 DIAGNOSIS — E1122 Type 2 diabetes mellitus with diabetic chronic kidney disease: Secondary | ICD-10-CM | POA: Diagnosis not present

## 2020-06-01 DIAGNOSIS — I129 Hypertensive chronic kidney disease with stage 1 through stage 4 chronic kidney disease, or unspecified chronic kidney disease: Secondary | ICD-10-CM | POA: Diagnosis not present

## 2020-06-08 DIAGNOSIS — I129 Hypertensive chronic kidney disease with stage 1 through stage 4 chronic kidney disease, or unspecified chronic kidney disease: Secondary | ICD-10-CM | POA: Diagnosis not present

## 2020-06-08 DIAGNOSIS — R0602 Shortness of breath: Secondary | ICD-10-CM | POA: Diagnosis not present

## 2020-06-08 DIAGNOSIS — N390 Urinary tract infection, site not specified: Secondary | ICD-10-CM | POA: Diagnosis not present

## 2020-06-08 DIAGNOSIS — I2510 Atherosclerotic heart disease of native coronary artery without angina pectoris: Secondary | ICD-10-CM | POA: Diagnosis not present

## 2020-06-08 DIAGNOSIS — E1159 Type 2 diabetes mellitus with other circulatory complications: Secondary | ICD-10-CM | POA: Diagnosis not present

## 2020-06-08 DIAGNOSIS — E039 Hypothyroidism, unspecified: Secondary | ICD-10-CM | POA: Diagnosis not present

## 2020-06-08 DIAGNOSIS — E7800 Pure hypercholesterolemia, unspecified: Secondary | ICD-10-CM | POA: Diagnosis not present

## 2020-06-14 DIAGNOSIS — I2510 Atherosclerotic heart disease of native coronary artery without angina pectoris: Secondary | ICD-10-CM | POA: Diagnosis not present

## 2020-06-14 DIAGNOSIS — R0602 Shortness of breath: Secondary | ICD-10-CM | POA: Diagnosis not present

## 2020-06-21 ENCOUNTER — Telehealth: Payer: Self-pay

## 2020-06-21 NOTE — Telephone Encounter (Signed)
NOTES ON FILE FROM [facility] [phone], SENT REFERRAL TO SCHEDULING 

## 2020-06-28 ENCOUNTER — Telehealth: Payer: Self-pay

## 2020-06-28 NOTE — Telephone Encounter (Signed)
FAXED NOTES TO [facility] 

## 2020-07-02 DIAGNOSIS — I10 Essential (primary) hypertension: Secondary | ICD-10-CM | POA: Insufficient documentation

## 2020-07-02 DIAGNOSIS — E119 Type 2 diabetes mellitus without complications: Secondary | ICD-10-CM | POA: Insufficient documentation

## 2020-07-03 ENCOUNTER — Other Ambulatory Visit: Payer: Self-pay

## 2020-07-03 ENCOUNTER — Ambulatory Visit (INDEPENDENT_AMBULATORY_CARE_PROVIDER_SITE_OTHER): Payer: Medicare HMO | Admitting: Cardiology

## 2020-07-03 ENCOUNTER — Encounter: Payer: Self-pay | Admitting: Cardiology

## 2020-07-03 VITALS — BP 110/60 | HR 58 | Ht 61.42 in | Wt 196.0 lb

## 2020-07-03 DIAGNOSIS — E782 Mixed hyperlipidemia: Secondary | ICD-10-CM | POA: Insufficient documentation

## 2020-07-03 DIAGNOSIS — R079 Chest pain, unspecified: Secondary | ICD-10-CM | POA: Diagnosis not present

## 2020-07-03 DIAGNOSIS — I2510 Atherosclerotic heart disease of native coronary artery without angina pectoris: Secondary | ICD-10-CM | POA: Insufficient documentation

## 2020-07-03 DIAGNOSIS — I10 Essential (primary) hypertension: Secondary | ICD-10-CM

## 2020-07-03 DIAGNOSIS — R0602 Shortness of breath: Secondary | ICD-10-CM | POA: Diagnosis not present

## 2020-07-03 DIAGNOSIS — E669 Obesity, unspecified: Secondary | ICD-10-CM | POA: Diagnosis not present

## 2020-07-03 DIAGNOSIS — I209 Angina pectoris, unspecified: Secondary | ICD-10-CM | POA: Insufficient documentation

## 2020-07-03 MED ORDER — RANOLAZINE ER 500 MG PO TB12: 500.0000 mg | ORAL_TABLET | Freq: Two times a day (BID) | ORAL | 3 refills | Status: DC

## 2020-07-03 NOTE — Patient Instructions (Signed)
Medication Instructions:  Your physician has recommended you make the following change in your medication: START: Ranexa 500 mg twice daily *If you need a refill on your cardiac medications before your next appointment, please call your pharmacy*   Lab Work: None If you have labs (blood work) drawn today and your tests are completely normal, you will receive your results only by: Marland Kitchen MyChart Message (if you have MyChart) OR . A paper copy in the mail If you have any lab test that is abnormal or we need to change your treatment, we will call you to review the results.   Testing/Procedures: Your physician has requested that you have an echocardiogram. Echocardiography is a painless test that uses sound waves to create images of your heart. It provides your doctor with information about the size and shape of your heart and how well your heart's chambers and valves are working. This procedure takes approximately one hour. There are no restrictions for this procedure.   Follow-Up: At [facility], you and your health needs are our priority.  As part of our continuing mission to provide you with exceptional heart care, we have created designated Provider Care Teams.  These Care Teams include your primary Cardiologist (physician) and Advanced Practice Providers (APPs -  Physician Assistants and Nurse Practitioners) who all work together to provide you with the care you need, when you need it.  We recommend signing up for the patient portal called "MyChart".  Sign up information is provided on this After Visit Summary.  MyChart is used to connect with patients for Virtual Visits (Telemedicine).  Patients are able to view lab/test results, encounter notes, upcoming appointments, etc.  Non-urgent messages can be sent to your provider as well.   To learn more about what you can do with MyChart, go to [email or web address].    Your next appointment:   August   The format for your next appointment:    In Person  Provider:   Thomasene Ripple, DO   Other Instructions  Echocardiogram An echocardiogram is a test that uses sound waves (ultrasound) to produce images of the heart. Images from an echocardiogram can provide important information about:  Heart size and shape.  The size and thickness and movement of your heart's walls.  Heart muscle function and strength.  Heart valve function or if you have stenosis. Stenosis is when the heart valves are too narrow.  If blood is flowing backward through the heart valves (regurgitation).  A tumor or infectious growth around the heart valves.  Areas of heart muscle that are not working well because of poor blood flow or injury from a heart attack.  Aneurysm detection. An aneurysm is a weak or damaged part of an artery wall. The wall bulges out from the normal force of blood pumping through the body. Tell a health care provider about:  Any allergies you have.  All medicines you are taking, including vitamins, herbs, eye drops, creams, and over-the-counter medicines.  Any blood disorders you have.  Any surgeries you have had.  Any medical conditions you have.  Whether you are pregnant or may be pregnant. What are the risks? Generally, this is a safe test. However, problems may occur, including an allergic reaction to dye (contrast) that may be used during the test. What happens before the test? No specific preparation is needed. You may eat and drink normally. What happens during the test?  You will take off your clothes from the waist up and put on a  hospital gown.  Electrodes or electrocardiogram (ECG)patches may be placed on your chest. The electrodes or patches are then connected to a device that monitors your heart rate and rhythm.  You will lie down on a table for an ultrasound exam. A gel will be applied to your chest to help sound waves pass through your skin.  A handheld device, called a transducer, will be pressed  against your chest and moved over your heart. The transducer produces sound waves that travel to your heart and bounce back (or "echo" back) to the transducer. These sound waves will be captured in real-time and changed into images of your heart that can be viewed on a video monitor. The images will be recorded on a computer and reviewed by your health care provider.  You may be asked to change positions or hold your breath for a short time. This makes it easier to get different views or better views of your heart.  In some cases, you may receive contrast through an IV in one of your veins. This can improve the quality of the pictures from your heart. The procedure may vary among health care providers and hospitals.   What can I expect after the test? You may return to your normal, everyday life, including diet, activities, and medicines, unless your health care provider tells you not to do that. Follow these instructions at home:  It is up to you to get the results of your test. Ask your health care provider, or the department that is doing the test, when your results will be ready.  Keep all follow-up visits. This is important. Summary  An echocardiogram is a test that uses sound waves (ultrasound) to produce images of the heart.  Images from an echocardiogram can provide important information about the size and shape of your heart, heart muscle function, heart valve function, and other possible heart problems.  You do not need to do anything to prepare before this test. You may eat and drink normally.  After the echocardiogram is completed, you may return to your normal, everyday life, unless your health care provider tells you not to do that. This information is not intended to replace advice given to you by your health care provider. Make sure you discuss any questions you have with your health care provider. Document Revised: 09/20/2019 Document Reviewed: 09/20/2019 Elsevier Patient  Education  2021 [employer].

## 2020-07-03 NOTE — Progress Notes (Signed)
Cardiology Office Note:    Date:  07/03/2020   ID:  Kimberly Bullock, DOB 1945/05/31, MRN [id number]  PCP:  Hart Carwin, MD (Inactive)  Cardiologist:  None  Electrophysiologist:  None   Referring MD: Irena Reichmann, DO   " Dr. Thomasena Edis asked me to see you"  History of Present Illness:    Kimberly Bullock is a 75 y.o. female with a hx of coronary artery disease status post coronary artery bypass grafting many years ago [place] type 2 diabetes, hypertension, obesity, hyperlipidemia presents to be evaluated at the request of her primary care provider.  Patient primarily speaks [place] therefore our visit was facilitated by a [place] interpreter over the phone.  Patient and her husbandsince 1999 and prior to their immigration to the [place] she had her bypass surgery..  Since that time she has not really follow-up with her cardiologist.  She sees her primary care doctor for her care.  Recently she has been having some chest discomfort.  Describes it as a midsternal pain that sometimes goes to her shoulder.  She has no radiation to the jaw.  The pain is about 5 out of 10.  Her husband who is here in the room confirms this as well.  She also reports worsening shortness of breath on exertion.   Past Medical History:  Diagnosis Date  . Diabetes mellitus without complication (HCC)   . Hypertension     Past Surgical History:  Procedure Laterality Date  . ANAL FISTULOTOMY  1968  . CORONARY ARTERY BYPASS GRAFT  2017   Done in [place]  . FIXATION KYPHOPLASTY LUMBAR SPINE  2015    Current Medications: Current Meds  Medication Sig  . amLODipine (NORVASC) 10 MG tablet Take 10 mg by mouth daily.  . bisoprolol (ZEBETA) 5 MG tablet Take 5 mg by mouth daily.  Marland Kitchen levothyroxine (EUTHYROX) 75 MCG tablet Take 75 mcg by mouth daily before breakfast.  . metFORMIN (GLUCOPHAGE) 850 MG tablet Take 850 mg by mouth 2 (two) times daily with a meal.  . ranolazine (RANEXA) 500 MG 12 hr  tablet Take 1 tablet (500 mg total) by mouth 2 (two) times daily.  . rosuvastatin (CRESTOR) 10 MG tablet Take 10 mg by mouth daily.  Marland Kitchen telmisartan-hydrochlorothiazide (MICARDIS HCT) 80-12.5 MG tablet Take 1 tablet by mouth daily.     Allergies:   Patient has no known allergies.   Social History   Socioeconomic History  . Marital status: Married    Spouse name: Not on file  . Number of children: Not on file  . Years of education: Not on file  . Highest education level: Not on file  Occupational History  . Not on file  Tobacco Use  . Smoking status: Current Every Day Smoker    Types: Cigarettes  . Smokeless tobacco: Never Used  Substance and Sexual Activity  . Alcohol use: No    Alcohol/week: 0.0 standard drinks  . Drug use: No  . Sexual activity: Not on file  Other Topics Concern  . Not on file  Social History Narrative  . Not on file   Social Determinants of Health   Financial Resource Strain: Not on file  Food Insecurity: Not on file  Transportation Needs: Not on file  Physical Activity: Not on file  Stress: Not on file  Social Connections: Not on file     Family History: The patient's family history includes Diabetes in an other family member; Hypertension in an other family member.  There is no history of Cancer.  ROS:   Review of Systems  Constitution: Negative for decreased appetite, fever and weight gain.  HENT: Negative for congestion, ear discharge, hoarse voice and sore throat.   Eyes: Negative for discharge, redness, vision loss in right eye and visual halos.  Cardiovascular: Reports chest pain, dyspnea on exertion.  Negative for leg swelling, orthopnea and palpitations.  Respiratory: Negative for cough, hemoptysis, shortness of breath and snoring.   Endocrine: Negative for heat intolerance and polyphagia.  Hematologic/Lymphatic: Negative for bleeding problem. Does not bruise/bleed easily.  Skin: Negative for flushing, nail changes, rash and suspicious  lesions.  Musculoskeletal: Negative for arthritis, joint pain, muscle cramps, myalgias, neck pain and stiffness.  Gastrointestinal: Negative for abdominal pain, bowel incontinence, diarrhea and excessive appetite.  Genitourinary: Negative for decreased libido, genital sores and incomplete emptying.  Neurological: Negative for brief paralysis, focal weakness, headaches and loss of balance.  Psychiatric/Behavioral: Negative for altered mental status, depression and suicidal ideas.  Allergic/Immunologic: Negative for HIV exposure and persistent infections.    EKGs/Labs/Other Studies Reviewed:    The following studies were reviewed today:   EKG:  The ekg ordered today demonstrates sinus bradycardia, heart rate 58 bpm with ST segment changes suggesting anterolateral wall ischemia.  Prior EKG for comparison.  Recent Labs: No results found for requested labs within last 8760 hours.  Recent Lipid Panel No results found for: CHOL, TRIG, HDL, CHOLHDL, VLDL, LDLCALC, LDLDIRECT  Physical Exam:    VS:  BP 110/60 (BP Location: Left Arm, Patient Position: Sitting, Cuff Size: Normal)   Pulse (!) 58   Ht 5' 1.42" (1.56 m)   Wt 196 lb (88.9 kg)   SpO2 95%   BMI 36.53 kg/m     Wt Readings from Last 3 Encounters:  07/03/20 196 lb (88.9 kg)  07/02/15 188 lb 7.7 oz (85.5 kg)     GEN: Well nourished, well developed in no acute distress HEENT: Normal NECK: No JVD; No carotid bruits LYMPHATICS: No lymphadenopathy CARDIAC: S1S2 noted,RRR, no murmurs, rubs, gallops RESPIRATORY:  Clear to auscultation without rales, wheezing or rhonchi  ABDOMEN: Soft, non-tender, non-distended, +bowel sounds, no guarding. EXTREMITIES: No edema, No cyanosis, no clubbing MUSCULOSKELETAL:  No deformity  SKIN: Warm and dry NEUROLOGIC:  Alert and oriented x 3, non-focal PSYCHIATRIC:  Normal affect, good insight  ASSESSMENT:    1. Chest pain of uncertain etiology   2. Coronary artery disease involving native  coronary artery of native heart, unspecified whether angina present   3. Hypertension, unspecified type   4. Mixed hyperlipidemia   5. Obesity (BMI 30-39.9)   6. Shortness of breath    PLAN:     1.  I did speak with the patient and her husband with the interpreter on the phone letting him know that I am concerned about her symptoms as this could be worsening angina.  I would ideally want to have her undergo left heart catheterization.  But the patient right now has declined any immediate further testing as she is going to [place].  We will get her started on Ranexa 500 mg twice daily.  2.  Shortness of breath an echocardiogram will be obtained to assess for any evidence of cardiomyopathy.  But again she is leaving the country and will be back after August 5 she tells me.  3.  This is being managed by his primary care doctor.  No adjustments for antidiabetic medications were made today.  4.  The patient understands  the need to lose weight with diet and exercise. We have discussed specific strategies for this.  5.  Hyperlipidemia - continue with current statin medication.  6.  Blood pressure is acceptable, continue with current antihypertensive regimen which includes amlodipine 10 mg daily, bisoprolol 5 mg daily, telmisartan-hydrochlorothiazide combination 80 mg - 12.5 mg daily.  The patient is in agreement with the above plan. The patient left the office in stable condition.  The patient will follow up in 3 months or sooner if needed.   Medication Adjustments/Labs and Tests Ordered: Current medicines are reviewed at length with the patient today.  Concerns regarding medicines are outlined above.  Orders Placed This Encounter  Procedures  . EKG 12-Lead  . ECHOCARDIOGRAM COMPLETE   Meds ordered this encounter  Medications  . ranolazine (RANEXA) 500 MG 12 hr tablet    Sig: Take 1 tablet (500 mg total) by mouth 2 (two) times daily.    Dispense:  180 tablet    Refill:  3     Patient Instructions   Medication Instructions:  Your physician has recommended you make the following change in your medication: START: Ranexa 500 mg twice daily *If you need a refill on your cardiac medications before your next appointment, please call your pharmacy*   Lab Work: None If you have labs (blood work) drawn today and your tests are completely normal, you will receive your results only by: Marland Kitchen MyChart Message (if you have MyChart) OR . A paper copy in the mail If you have any lab test that is abnormal or we need to change your treatment, we will call you to review the results.   Testing/Procedures: Your physician has requested that you have an echocardiogram. Echocardiography is a painless test that uses sound waves to create images of your heart. It provides your doctor with information about the size and shape of your heart and how well your heart's chambers and valves are working. This procedure takes approximately one hour. There are no restrictions for this procedure.   Follow-Up: At [facility] you and your health needs are our priority.  As part of our continuing mission to provide you with exceptional heart care, we have created designated Provider Care Teams.  These Care Teams include your primary Cardiologist (physician) and Advanced Practice Providers (APPs -  Physician Assistants and Nurse Practitioners) who all work together to provide you with the care you need, when you need it.  We recommend signing up for the patient portal called "MyChart".  Sign up information is provided on this After Visit Summary.  MyChart is used to connect with patients for Virtual Visits (Telemedicine).  Patients are able to view lab/test results, encounter notes, upcoming appointments, etc.  Non-urgent messages can be sent to your provider as well.   To learn more about what you can do with MyChart, go to [email or web address].    Your next appointment:   August   The  format for your next appointment:   In Person  Provider:   Thomasene Ripple, DO   Other Instructions  Echocardiogram An echocardiogram is a test that uses sound waves (ultrasound) to produce images of the heart. Images from an echocardiogram can provide important information about:  Heart size and shape.  The size and thickness and movement of your heart's walls.  Heart muscle function and strength.  Heart valve function or if you have stenosis. Stenosis is when the heart valves are too narrow.  If blood is flowing backward through the  heart valves (regurgitation).  A tumor or infectious growth around the heart valves.  Areas of heart muscle that are not working well because of poor blood flow or injury from a heart attack.  Aneurysm detection. An aneurysm is a weak or damaged part of an artery wall. The wall bulges out from the normal force of blood pumping through the body. Tell a health care provider about:  Any allergies you have.  All medicines you are taking, including vitamins, herbs, eye drops, creams, and over-the-counter medicines.  Any blood disorders you have.  Any surgeries you have had.  Any medical conditions you have.  Whether you are pregnant or may be pregnant. What are the risks? Generally, this is a safe test. However, problems may occur, including an allergic reaction to dye (contrast) that may be used during the test. What happens before the test? No specific preparation is needed. You may eat and drink normally. What happens during the test?  You will take off your clothes from the waist up and put on a hospital gown.  Electrodes or electrocardiogram (ECG)patches may be placed on your chest. The electrodes or patches are then connected to a device that monitors your heart rate and rhythm.  You will lie down on a table for an ultrasound exam. A gel will be applied to your chest to help sound waves pass through your skin.  A handheld device, called  a transducer, will be pressed against your chest and moved over your heart. The transducer produces sound waves that travel to your heart and bounce back (or "echo" back) to the transducer. These sound waves will be captured in real-time and changed into images of your heart that can be viewed on a video monitor. The images will be recorded on a computer and reviewed by your health care provider.  You may be asked to change positions or hold your breath for a short time. This makes it easier to get different views or better views of your heart.  In some cases, you may receive contrast through an IV in one of your veins. This can improve the quality of the pictures from your heart. The procedure may vary among health care providers and hospitals.   What can I expect after the test? You may return to your normal, everyday life, including diet, activities, and medicines, unless your health care provider tells you not to do that. Follow these instructions at home:  It is up to you to get the results of your test. Ask your health care provider, or the department that is doing the test, when your results will be ready.  Keep all follow-up visits. This is important. Summary  An echocardiogram is a test that uses sound waves (ultrasound) to produce images of the heart.  Images from an echocardiogram can provide important information about the size and shape of your heart, heart muscle function, heart valve function, and other possible heart problems.  You do not need to do anything to prepare before this test. You may eat and drink normally.  After the echocardiogram is completed, you may return to your normal, everyday life, unless your health care provider tells you not to do that. This information is not intended to replace advice given to you by your health care provider. Make sure you discuss any questions you have with your health care provider. Document Revised: 09/20/2019 Document Reviewed:  09/20/2019 Elsevier Patient Education  2021 [employer].      Adopting a Healthy  Lifestyle.  Know what a healthy weight is for you (roughly BMI <25) and aim to maintain this   Aim for 7+ servings of fruits and vegetables daily   65-80+ fluid ounces of water or unsweet tea for healthy kidneys   Limit to max 1 drink of alcohol per day; avoid smoking/tobacco   Limit animal fats in diet for cholesterol and heart health - choose grass fed whenever available   Avoid highly processed foods, and foods high in saturated/trans fats   Aim for low stress - take time to unwind and care for your mental health   Aim for 150 min of moderate intensity exercise weekly for heart health, and weights twice weekly for bone health   Aim for 7-9 hours of sleep daily   When it comes to diets, agreement about the perfect plan isnt easy to find, even among the experts. Experts at the [employer] developed an idea known as the Healthy Eating Plate. Just imagine a plate divided into logical, healthy portions.   The emphasis is on diet quality:   Load up on vegetables and fruits - one-half of your plate: Aim for color and variety, and remember that potatoes dont count.   Go for whole grains - one-quarter of your plate: Whole wheat, barley, wheat berries, quinoa, oats, brown rice, and foods made with them. If you want pasta, go with whole wheat pasta.   Protein power - one-quarter of your plate: Fish, chicken, beans, and nuts are all healthy, versatile protein sources. Limit red meat.   The diet, however, does go beyond the plate, offering a few other suggestions.   Use healthy plant oils, such as olive, canola, soy, corn, sunflower and peanut. Check the labels, and avoid partially hydrogenated oil, which have unhealthy trans fats.   If youre thirsty, drink water. Coffee and tea are good in moderation, but skip sugary drinks and limit milk and dairy products to one or two daily  servings.   The type of carbohydrate in the diet is more important than the amount. Some sources of carbohydrates, such as vegetables, fruits, whole grains, and beans-are healthier than others.   Finally, stay active  Signed, Thomasene RippleKardie Janayla Marik, DO  07/03/2020 12:09 PM    [facility]

## 2020-09-17 ENCOUNTER — Other Ambulatory Visit: Payer: Self-pay

## 2020-09-17 ENCOUNTER — Ambulatory Visit (HOSPITAL_COMMUNITY): Payer: Medicare HMO | Attending: Cardiology

## 2020-09-17 DIAGNOSIS — I2510 Atherosclerotic heart disease of native coronary artery without angina pectoris: Secondary | ICD-10-CM | POA: Insufficient documentation

## 2020-09-17 DIAGNOSIS — R079 Chest pain, unspecified: Secondary | ICD-10-CM | POA: Insufficient documentation

## 2020-09-17 LAB — ECHOCARDIOGRAM COMPLETE
Area-P 1/2: 2.56 cm2
S' Lateral: 2.4 cm

## 2020-09-27 ENCOUNTER — Ambulatory Visit (INDEPENDENT_AMBULATORY_CARE_PROVIDER_SITE_OTHER): Payer: Medicare HMO | Admitting: Cardiology

## 2020-09-27 ENCOUNTER — Other Ambulatory Visit: Payer: Self-pay

## 2020-09-27 VITALS — BP 118/76 | HR 74 | Ht 61.0 in | Wt 188.0 lb

## 2020-09-27 DIAGNOSIS — R0600 Dyspnea, unspecified: Secondary | ICD-10-CM | POA: Diagnosis not present

## 2020-09-27 DIAGNOSIS — E782 Mixed hyperlipidemia: Secondary | ICD-10-CM | POA: Diagnosis not present

## 2020-09-27 DIAGNOSIS — I10 Essential (primary) hypertension: Secondary | ICD-10-CM | POA: Diagnosis not present

## 2020-09-27 DIAGNOSIS — E669 Obesity, unspecified: Secondary | ICD-10-CM

## 2020-09-27 DIAGNOSIS — E119 Type 2 diabetes mellitus without complications: Secondary | ICD-10-CM | POA: Diagnosis not present

## 2020-09-27 DIAGNOSIS — I2510 Atherosclerotic heart disease of native coronary artery without angina pectoris: Secondary | ICD-10-CM

## 2020-09-27 DIAGNOSIS — R0609 Other forms of dyspnea: Secondary | ICD-10-CM

## 2020-09-27 MED ORDER — RANOLAZINE ER 500 MG PO TB12: 500.0000 mg | ORAL_TABLET | Freq: Two times a day (BID) | ORAL | 3 refills | Status: AC

## 2020-09-27 NOTE — Progress Notes (Signed)
Cardiology Office Note:    Date:  09/28/2020   ID:  Kimberly Bullock, DOB [birth year], MRN [id number]  PCP:  Hart CarwinBrodie, Dora M, MD (Inactive)  Cardiologist:  Thomasene RippleKardie Amardeep Beckers, DO  Electrophysiologist:  None   Referring MD: No ref. provider found   History of Present Illness:    Kimberly Bullock is a 75 y.o. female with a hx of  coronary artery disease status post coronary artery bypass grafting many years ago in [place], type 2 diabetes, hypertension, obesity, hyperlipidemia presents today for follow-up visit.  I saw the patient on Jul 03, 2020 at that visit she had complained that she was experiencing shortness of breath and chest discomfort.  During that time I really wanted for the patient to undergo left heart catheterization but she had a flight scheduled the next day to [place]. Since her visit she had not been able to start the Ranexa that was given to her.  Patient primarily speaks [place] therefore our visit was facilitated by a [place] interpreter over the phone Zada Finders([place]ora interpreter [phone]).  Today she tells me that the chest pain has improved.  But she still does have some shortness of breath on exertion.  She has limited her activities due to times when she is more active.  She is unable to go up inclines without getting significantly shortness of breath.   Past Medical History:  Diagnosis Date   Diabetes mellitus without complication (HCC)    Hypertension     Past Surgical History:  Procedure Laterality Date   ANAL FISTULOTOMY  1968   CORONARY ARTERY BYPASS GRAFT  2017   Done in [place]   FIXATION KYPHOPLASTY LUMBAR SPINE  2015    Current Medications: Current Meds  Medication Sig   amLODipine (NORVASC) 10 MG tablet Take 10 mg by mouth daily. (Patient not taking: Reported on 09/27/2020)   aspirin EC 81 MG tablet Take 81 mg by mouth daily. Swallow whole.   levothyroxine (SYNTHROID) 100 MCG tablet Take 100 mcg by mouth daily before breakfast.   metFORMIN (GLUCOPHAGE)  850 MG tablet Take 850 mg by mouth 2 (two) times daily with a meal.   rosuvastatin (CRESTOR) 10 MG tablet Take 10 mg by mouth daily.   [DISCONTINUED] bisoprolol (ZEBETA) 5 MG tablet Take 5 mg by mouth daily.   [DISCONTINUED] ranolazine (RANEXA) 500 MG 12 hr tablet Take 1 tablet (500 mg total) by mouth 2 (two) times daily.     Allergies:   Patient has no known allergies.   Social History   Socioeconomic History   Marital status: Married    Spouse name: Not on file   Number of children: Not on file   Years of education: Not on file   Highest education level: Not on file  Occupational History   Not on file  Tobacco Use   Smoking status: Every Day    Types: Cigarettes   Smokeless tobacco: Never  Substance and Sexual Activity   Alcohol use: No    Alcohol/week: 0.0 standard drinks   Drug use: No   Sexual activity: Not on file  Other Topics Concern   Not on file  Social History Narrative   Not on file   Social Determinants of Health   Financial Resource Strain: Not on file  Food Insecurity: Not on file  Transportation Needs: Not on file  Physical Activity: Not on file  Stress: Not on file  Social Connections: Not on file     Family History: The patient's family history includes  Diabetes in an other family member; Hypertension in an other family member. There is no history of Cancer.  ROS:   Review of Systems  Constitution: Negative for decreased appetite, fever and weight gain.  HENT: Negative for congestion, ear discharge, hoarse voice and sore throat.   Eyes: Negative for discharge, redness, vision loss in right eye and visual halos.  Cardiovascular: Negative for chest pain, dyspnea on exertion, leg swelling, orthopnea and palpitations.  Respiratory: Negative for cough, hemoptysis, shortness of breath and snoring.   Endocrine: Negative for heat intolerance and polyphagia.  Hematologic/Lymphatic: Negative for bleeding problem. Does not bruise/bleed easily.  Skin:  Negative for flushing, nail changes, rash and suspicious lesions.  Musculoskeletal: Negative for arthritis, joint pain, muscle cramps, myalgias, neck pain and stiffness.  Gastrointestinal: Negative for abdominal pain, bowel incontinence, diarrhea and excessive appetite.  Genitourinary: Negative for decreased libido, genital sores and incomplete emptying.  Neurological: Negative for brief paralysis, focal weakness, headaches and loss of balance.  Psychiatric/Behavioral: Negative for altered mental status, depression and suicidal ideas.  Allergic/Immunologic: Negative for HIV exposure and persistent infections.    EKGs/Labs/Other Studies Reviewed:    The following studies were reviewed today:   EKG: None today  Transthoracic echocardiogram 09/2020 IMPRESSIONS   1. Left ventricular ejection fraction, by estimation, is 50 to 55%. The  left ventricle has low normal function. The left ventricle has no regional  wall motion abnormalities. There is mild left ventricular hypertrophy.  Left ventricular diastolic  parameters are consistent with Grade I diastolic dysfunction (impaired  relaxation). The average left ventricular global longitudinal strain is  -16.1 %.   2. Right ventricular systolic function is mildly reduced. The right  ventricular size is normal. There is normal pulmonary artery systolic  pressure. The estimated right ventricular systolic pressure is 21.7 mmHg.   3. The mitral valve is normal in structure. No evidence of mitral valve  regurgitation. No evidence of mitral stenosis.   4. The aortic valve is tricuspid. Aortic valve regurgitation is not  visualized. Mild aortic valve sclerosis is present, with no evidence of  aortic valve stenosis.   5. The inferior vena cava is normal in size with greater than 50%  respiratory variability, suggesting right atrial pressure of 3 mmHg.   FINDINGS   Left Ventricle: Left ventricular ejection fraction, by estimation, is 50  to 55%.  The left ventricle has low normal function. The left ventricle has  no regional wall motion abnormalities. The average left ventricular global  longitudinal strain is -16.1  %. The left ventricular internal cavity size was small. There is mild left  ventricular hypertrophy. Left ventricular diastolic parameters are  consistent with Grade I diastolic dysfunction (impaired relaxation).   Right Ventricle: The right ventricular size is normal. No increase in  right ventricular wall thickness. Right ventricular systolic function is  mildly reduced. There is normal pulmonary artery systolic pressure. The  tricuspid regurgitant velocity is 2.16  m/s, and with an assumed right atrial pressure of 3 mmHg, the estimated  right ventricular systolic pressure is 21.7 mmHg.   Left Atrium: Left atrial size was normal in size.   Right Atrium: Right atrial size was normal in size.   Pericardium: There is no evidence of pericardial effusion.   Mitral Valve: The mitral valve is normal in structure. No evidence of  mitral valve regurgitation. No evidence of mitral valve stenosis.   Tricuspid Valve: The tricuspid valve is normal in structure. Tricuspid  valve regurgitation is trivial.  Aortic Valve: The aortic valve is tricuspid. Aortic valve regurgitation is  not visualized. Mild aortic valve sclerosis is present, with no evidence  of aortic valve stenosis.   Pulmonic Valve: The pulmonic valve was not well visualized. Pulmonic valve  regurgitation is not visualized.   Aorta: The aortic root and ascending aorta are structurally normal, with  no evidence of dilitation.   Venous: The inferior vena cava is normal in size with greater than 50%  respiratory variability, suggesting right atrial pressure of 3 mmHg.   IAS/Shunts: The interatrial septum was not well visualized.    Recent Labs: 09/27/2020: BUN 23; Creatinine, Ser 1.00; Hemoglobin 13.5; Magnesium 2.0; Platelets 301; Potassium 5.0; Sodium  140  Recent Lipid Panel No results found for: CHOL, TRIG, HDL, CHOLHDL, VLDL, LDLCALC, LDLDIRECT  Physical Exam:    VS:  BP 118/76 (BP Location: Right Arm, Patient Position: Sitting, Cuff Size: Normal)   Pulse 74   Ht 5\' 1"  (1.549 m)   Wt 188 lb (85.3 kg)   SpO2 95%   BMI 35.52 kg/m     Wt Readings from Last 3 Encounters:  09/27/20 188 lb (85.3 kg)  07/03/20 196 lb (88.9 kg)  07/02/15 188 lb 7.7 oz (85.5 kg)     GEN: Well nourished, well developed in no acute distress HEENT: Normal NECK: No JVD; No carotid bruits LYMPHATICS: No lymphadenopathy CARDIAC: S1S2 noted,RRR, no murmurs, rubs, gallops RESPIRATORY:  Clear to auscultation without rales, wheezing or rhonchi  ABDOMEN: Soft, non-tender, non-distended, +bowel sounds, no guarding. EXTREMITIES: No edema, No cyanosis, no clubbing MUSCULOSKELETAL:  No deformity  SKIN: Warm and dry NEUROLOGIC:  Alert and oriented x 3, non-focal PSYCHIATRIC:  Normal affect, good insight  ASSESSMENT:    1. DOE (dyspnea on exertion)   2. Coronary artery disease involving native coronary artery of native heart, unspecified whether angina present   3. Hypertension, unspecified type   4. Diabetes mellitus without complication (HCC)   5. Mixed hyperlipidemia   6. Obesity (BMI 30-39.9)    PLAN:     She still has shortness of breath and exertion which I do believe this is her anginal equivalent.-She has high risk for worsening coronary artery disease.  At this time the appropriate testing will be to pursue with a left heart catheterization in this patient.  Via interpreter I spoke to the patient husband about this test and they are agreeable to proceed. The patient understands that risks include but are not limited to stroke (1 in 1000), death (1 in 1000), kidney failure [usually temporary] (1 in 500), bleeding (1 in 200), allergic reaction [possibly serious] (1 in 200), and agrees to proceed. Continue aspirin and statin.  She did not start her  Ranexa we will going to send this to the pharmacy again.  Her diabetes is being managed by her primary care provider.  Her recent hemoglobin A1c in April 2022 was 7.5.  Blood pressure is acceptable, continue with current antihypertensive regimen.  Hyperlipidemia - continue with current statin medication.  The patient understands the need to lose weight with diet and exercise. We have discussed specific strategies for this.  We discussed her echocardiogram result  The patient is in agreement with the above plan. The patient left the office in stable condition.  The patient will follow up in 4 weeks post cath.   Medication Adjustments/Labs and Tests Ordered: Current medicines are reviewed at length with the patient today.  Concerns regarding medicines are outlined above.  Orders Placed This  Encounter  Procedures   Basic metabolic panel   Magnesium   CBC with Differential/Platelet    Meds ordered this encounter  Medications   ranolazine (RANEXA) 500 MG 12 hr tablet    Sig: Take 1 tablet (500 mg total) by mouth 2 (two) times daily.    Dispense:  180 tablet    Refill:  3     Patient Instructions  Medication Instructions:  Your physician recommends that you continue on your current medications as directed. Please refer to the Current Medication list given to you today.  *If you need a refill on your cardiac medications before your next appointment, please call your pharmacy*   Lab Work: Your physician recommends that you return for lab work in:  TODAY: BMET, Mag, CBC If you have labs (blood work) drawn today and your tests are completely normal, you will receive your results only by: MyChart Message (if you have MyChart) OR A paper copy in the mail If you have any lab test that is abnormal or we need to change your treatment, we will call you to review the results.   Testing/Procedures:  [facility] [place] [address] [place] [address] Dept: [phone] Loc: [phone]  Dashanti Burr  09/27/2020  You are scheduled for a Cardiac Catheterization on Tuesday, August 23 with Dr. Peter Swaziland.  1. Please arrive at the [facility] (Main Entrance A) at [facility]: [address] [place], [place] [address] at 5:30 AM (This time is two hours before your procedure to ensure your preparation). Free valet parking service is available.   Special note: Every effort is made to have your procedure done on time. Please understand that emergencies sometimes delay scheduled procedures.  2. Diet: Do not eat solid foods after midnight.  The patient may have clear liquids until 5am upon the day of the procedure.  3. Labs: You will need to have blood drawn on TODAY.  4. Medication instructions in preparation for your procedure:   Contrast Allergy: No  Do not take Diabetes Med Glucophage (Metformin) on the day of the procedure and HOLD 48 HOURS AFTER THE PROCEDURE.  On the morning of your procedure, take your Aspirin and any morning medicines NOT listed above.  You may use sips of water.  5. Plan for one night stay--bring personal belongings. 6. Bring a current list of your medications and current insurance cards. 7. You MUST have a responsible person to drive you home. 8. Someone MUST be with you the first 24 hours after you arrive home or your discharge will be delayed. 9. Please wear clothes that are easy to get on and off and wear slip-on shoes.  Thank you for allowing [place] to care for you!   -- [place] [facility]    Follow-Up: At [facility], you and your health needs are our priority.  As part of our continuing mission to provide you with exceptional heart care, we have created designated Provider Care Teams.  These Care Teams include your primary Cardiologist (physician) and Advanced Practice Providers (APPs -  Physician  Assistants and Nurse Practitioners) who all work together to provide you with the care you need, when you need it.  We recommend signing up for the patient portal called "MyChart".  Sign up information is provided on this After Visit Summary.  MyChart is used to connect with patients for Virtual Visits (Telemedicine).  Patients  are able to view lab/test results, encounter notes, upcoming appointments, etc.  Non-urgent messages can be sent to your provider as well.   To learn more about what you can do with MyChart, go to [email or web address].    Your next appointment:   4 week(s)  The format for your next appointment:   In Person  Provider:   Elease Hashimoto - Thomasene Ripple, DO [address], [place], [place] [address]    Other Instructions    Adopting a Healthy Lifestyle.  Know what a healthy weight is for you (roughly BMI <25) and aim to maintain this   Aim for 7+ servings of fruits and vegetables daily   65-80+ fluid ounces of water or unsweet tea for healthy kidneys   Limit to max 1 drink of alcohol per day; avoid smoking/tobacco   Limit animal fats in diet for cholesterol and heart health - choose grass fed whenever available   Avoid highly processed foods, and foods high in saturated/trans fats   Aim for low stress - take time to unwind and care for your mental health   Aim for 150 min of moderate intensity exercise weekly for heart health, and weights twice weekly for bone health   Aim for 7-9 hours of sleep daily   When it comes to diets, agreement about the perfect plan isnt easy to find, even among the experts. Experts at the [facility] [employer] developed an idea known as the Healthy Eating Plate. Just imagine a plate divided into logical, healthy portions.   The emphasis is on diet quality:   Load up on vegetables and fruits - one-half of your plate: Aim for color and variety, and remember that potatoes dont count.   Go for whole grains -  one-quarter of your plate: Whole wheat, barley, wheat berries, quinoa, oats, brown rice, and foods made with them. If you want pasta, go with whole wheat pasta.   Protein power - one-quarter of your plate: Fish, chicken, beans, and nuts are all healthy, versatile protein sources. Limit red meat.   The diet, however, does go beyond the plate, offering a few other suggestions.   Use healthy plant oils, such as olive, canola, soy, corn, sunflower and peanut. Check the labels, and avoid partially hydrogenated oil, which have unhealthy trans fats.   If youre thirsty, drink water. Coffee and tea are good in moderation, but skip sugary drinks and limit milk and dairy products to one or two daily servings.   The type of carbohydrate in the diet is more important than the amount. Some sources of carbohydrates, such as vegetables, fruits, whole grains, and beans-are healthier than others.   Finally, stay active  Signed, Thomasene Ripple, DO  09/28/2020 12:58 PM    [facility]

## 2020-09-27 NOTE — Patient Instructions (Signed)
Medication Instructions:  Your physician recommends that you continue on your current medications as directed. Please refer to the Current Medication list given to you today.  *If you need a refill on your cardiac medications before your next appointment, please call your pharmacy*   Lab Work: Your physician recommends that you return for lab work in:  TODAY: BMET, Mag, CBC If you have labs (blood work) drawn today and your tests are completely normal, you will receive your results only by: MyChart Message (if you have MyChart) OR A paper copy in the mail If you have any lab test that is abnormal or we need to change your treatment, we will call you to review the results.   Testing/Procedures:  [facility]ASCULAR [facility] [place] [address], [address] [place] [address] Dept: [phone] Loc: [phone]  Kimberly Bullock  09/27/2020  You are scheduled for a Cardiac Catheterization on Tuesday, August 23 with Dr. Peter Swaziland.  1. Please arrive at the [facility] (Main Entrance A) at [facility]: [address] [place], [place] [address] at 5:30 AM (This time is two hours before your procedure to ensure your preparation). Free valet parking service is available.   Special note: Every effort is made to have your procedure done on time. Please understand that emergencies sometimes delay scheduled procedures.  2. Diet: Do not eat solid foods after midnight.  The patient may have clear liquids until 5am upon the day of the procedure.  3. Labs: You will need to have blood drawn on TODAY.  4. Medication instructions in preparation for your procedure:   Contrast Allergy: No  Do not take Diabetes Med Glucophage (Metformin) on the day of the procedure and HOLD 48 HOURS AFTER THE PROCEDURE.  On the morning of your procedure, take your Aspirin and any morning medicines NOT listed above.  You may use sips of  water.  5. Plan for one night stay--bring personal belongings. 6. Bring a current list of your medications and current insurance cards. 7. You MUST have a responsible person to drive you home. 8. Someone MUST be with you the first 24 hours after you arrive home or your discharge will be delayed. 9. Please wear clothes that are easy to get on and off and wear slip-on shoes.  Thank you for allowing [place] to care for you!   -- [place] [facility]    Follow-Up: At [facility], you and your health needs are our priority.  As part of our continuing mission to provide you with exceptional heart care, we have created designated Provider Care Teams.  These Care Teams include your primary Cardiologist (physician) and Advanced Practice Providers (APPs -  Physician Assistants and Nurse Practitioners) who all work together to provide you with the care you need, when you need it.  We recommend signing up for the patient portal called "MyChart".  Sign up information is provided on this After Visit Summary.  MyChart is used to connect with patients for Virtual Visits (Telemedicine).  Patients are able to view lab/test results, encounter notes, upcoming appointments, etc.  Non-urgent messages can be sent to your provider as well.   To learn more about what you can do with MyChart, go to [email or web address].    Your next appointment:   4 week(s)  The format for your next appointment:   In Person  Provider:   [address] - Kardie Tobb, DO [address] [facility] [address], [place], [place] [address]    Other Instructions

## 2020-09-28 LAB — CBC WITH DIFFERENTIAL/PLATELET
Basophils Absolute: 0.1 10*3/uL (ref 0.0–0.2)
Basos: 1 %
EOS (ABSOLUTE): 0.3 10*3/uL (ref 0.0–0.4)
Eos: 2 %
Hematocrit: 40.7 % (ref 34.0–46.6)
Hemoglobin: 13.5 g/dL (ref 11.1–15.9)
Immature Grans (Abs): 0 10*3/uL (ref 0.0–0.1)
Immature Granulocytes: 0 %
Lymphocytes Absolute: 3 10*3/uL (ref 0.7–3.1)
Lymphs: 27 %
MCH: 30.5 pg (ref 26.6–33.0)
MCHC: 33.2 g/dL (ref 31.5–35.7)
MCV: 92 fL (ref 79–97)
Monocytes Absolute: 1 10*3/uL — ABNORMAL HIGH (ref 0.1–0.9)
Monocytes: 9 %
Neutrophils Absolute: 7 10*3/uL (ref 1.4–7.0)
Neutrophils: 61 %
Platelets: 301 10*3/uL (ref 150–450)
RBC: 4.43 x10E6/uL (ref 3.77–5.28)
RDW: 12.8 % (ref 11.7–15.4)
WBC: 11.5 10*3/uL — ABNORMAL HIGH (ref 3.4–10.8)

## 2020-09-28 LAB — BASIC METABOLIC PANEL
BUN/Creatinine Ratio: 23 (ref 12–28)
BUN: 23 mg/dL (ref 8–27)
CO2: 25 mmol/L (ref 20–29)
Calcium: 10.3 mg/dL (ref 8.7–10.3)
Chloride: 101 mmol/L (ref 96–106)
Creatinine, Ser: 1 mg/dL (ref 0.57–1.00)
Glucose: 165 mg/dL — ABNORMAL HIGH (ref 65–99)
Potassium: 5 mmol/L (ref 3.5–5.2)
Sodium: 140 mmol/L (ref 134–144)
eGFR: 59 mL/min/{1.73_m2} — ABNORMAL LOW (ref >59)

## 2020-09-28 LAB — MAGNESIUM: Magnesium: 2 mg/dL (ref 1.6–2.3)

## 2020-09-28 NOTE — Progress Notes (Signed)
I spoke with Kimberly Bullock, cath has been cancelled.

## 2020-10-02 ENCOUNTER — Ambulatory Visit (HOSPITAL_COMMUNITY): Admission: RE | Admit: 2020-10-02 | Payer: Medicare HMO | Source: Home / Self Care | Admitting: Cardiology

## 2020-10-02 ENCOUNTER — Encounter (HOSPITAL_COMMUNITY): Admission: RE | Payer: Self-pay | Source: Home / Self Care

## 2020-10-02 SURGERY — LEFT HEART CATH AND CORONARY ANGIOGRAPHY
Anesthesia: LOCAL

## 2020-11-02 DIAGNOSIS — E039 Hypothyroidism, unspecified: Secondary | ICD-10-CM | POA: Diagnosis not present

## 2020-11-02 DIAGNOSIS — I129 Hypertensive chronic kidney disease with stage 1 through stage 4 chronic kidney disease, or unspecified chronic kidney disease: Secondary | ICD-10-CM | POA: Diagnosis not present

## 2020-11-02 DIAGNOSIS — E7800 Pure hypercholesterolemia, unspecified: Secondary | ICD-10-CM | POA: Diagnosis not present

## 2020-11-02 DIAGNOSIS — E1159 Type 2 diabetes mellitus with other circulatory complications: Secondary | ICD-10-CM | POA: Diagnosis not present

## 2020-11-09 DIAGNOSIS — E1122 Type 2 diabetes mellitus with diabetic chronic kidney disease: Secondary | ICD-10-CM | POA: Diagnosis not present

## 2020-11-09 DIAGNOSIS — N1831 Chronic kidney disease, stage 3a: Secondary | ICD-10-CM | POA: Diagnosis not present

## 2020-11-09 DIAGNOSIS — E1159 Type 2 diabetes mellitus with other circulatory complications: Secondary | ICD-10-CM | POA: Diagnosis not present

## 2020-11-09 DIAGNOSIS — N390 Urinary tract infection, site not specified: Secondary | ICD-10-CM | POA: Diagnosis not present

## 2020-11-09 DIAGNOSIS — I129 Hypertensive chronic kidney disease with stage 1 through stage 4 chronic kidney disease, or unspecified chronic kidney disease: Secondary | ICD-10-CM | POA: Diagnosis not present

## 2020-11-09 DIAGNOSIS — Z0000 Encounter for general adult medical examination without abnormal findings: Secondary | ICD-10-CM | POA: Diagnosis not present

## 2020-11-09 DIAGNOSIS — I2510 Atherosclerotic heart disease of native coronary artery without angina pectoris: Secondary | ICD-10-CM | POA: Diagnosis not present

## 2020-11-09 DIAGNOSIS — E039 Hypothyroidism, unspecified: Secondary | ICD-10-CM | POA: Diagnosis not present

## 2020-11-09 DIAGNOSIS — Z951 Presence of aortocoronary bypass graft: Secondary | ICD-10-CM | POA: Diagnosis not present

## 2020-11-14 ENCOUNTER — Other Ambulatory Visit: Payer: Self-pay

## 2020-11-14 ENCOUNTER — Ambulatory Visit (INDEPENDENT_AMBULATORY_CARE_PROVIDER_SITE_OTHER): Payer: Medicare HMO | Admitting: Cardiology

## 2020-11-14 ENCOUNTER — Encounter: Payer: Self-pay | Admitting: Cardiology

## 2020-11-14 VITALS — BP 100/60 | HR 84 | Ht 61.0 in | Wt 186.6 lb

## 2020-11-14 DIAGNOSIS — E782 Mixed hyperlipidemia: Secondary | ICD-10-CM

## 2020-11-14 DIAGNOSIS — E119 Type 2 diabetes mellitus without complications: Secondary | ICD-10-CM | POA: Diagnosis not present

## 2020-11-14 DIAGNOSIS — I25118 Atherosclerotic heart disease of native coronary artery with other forms of angina pectoris: Secondary | ICD-10-CM | POA: Diagnosis not present

## 2020-11-14 DIAGNOSIS — E669 Obesity, unspecified: Secondary | ICD-10-CM

## 2020-11-14 DIAGNOSIS — I10 Essential (primary) hypertension: Secondary | ICD-10-CM | POA: Diagnosis not present

## 2020-11-14 NOTE — Patient Instructions (Addendum)
Medication Instructions:  Your physician recommends that you continue on your current medications as directed. Please refer to the Current Medication list given to you today.  *If you need a refill on your cardiac medications before your next appointment, please call your pharmacy*   Lab Work: Your physician recommends that you return for lab work in:  Next week: BMET, Mag, CBC If you have labs (blood work) drawn today and your tests are completely normal, you will receive your results only by: MyChart Message (if you have MyChart) OR A paper copy in the mail If you have any lab test that is abnormal or we need to change your treatment, we will call you to review the results.   Testing/Procedures:  [facility]ASCULAR [facility] [address] [place] [address] Dept: [phone] Loc: [phone]  Kimberly Bullock  11/14/2020  You are scheduled for a Cardiac Catheterization on Monday, October 17 with Dr. Nanetta Batty.  1. Please arrive at the [facility] (Main Entrance A) at [facility] [address] [place], [place] [address] at 9:30 AM (This time is two hours before your procedure to ensure your preparation). Free valet parking service is available.   Special note: Every effort is made to have your procedure done on time. Please understand that emergencies sometimes delay scheduled procedures.  2. Diet: Do not eat solid foods after midnight.  The patient may have clear liquids until 5am upon the day of the procedure.  3. Labs: You will need to have blood drawn on NEXT WEEK  4. Medication instructions in preparation for your procedure:   Contrast Allergy: No  Do not take Diabetes Med Glucophage (Metformin) on the day of the procedure and HOLD 48 HOURS AFTER THE PROCEDURE.  On the morning of your procedure, take your Aspirin and any morning medicines NOT listed above.  You may use sips of  water.  5. Plan for one night stay--bring personal belongings. 6. Bring a current list of your medications and current insurance cards. 7. You MUST have a responsible person to drive you home. 8. Someone MUST be with you the first 24 hours after you arrive home or your discharge will be delayed. 9. Please wear clothes that are easy to get on and off and wear slip-on shoes.  Thank you for allowing [place] to care for you!   -- [name] [facility]    Follow-Up: At [facility], you and your health needs are our priority.  As part of our continuing mission to provide you with exceptional heart care, we have created designated Provider Care Teams.  These Care Teams include your primary Cardiologist (physician) and Advanced Practice Providers (APPs -  Physician Assistants and Nurse Practitioners) who all work together to provide you with the care you need, when you need it.  We recommend signing up for the patient portal called "MyChart".  Sign up information is provided on this After Visit Summary.  MyChart is used to connect with patients for Virtual Visits (Telemedicine).  Patients are able to view lab/test results, encounter notes, upcoming appointments, etc.  Non-urgent messages can be sent to your provider as well.   To learn more about what you can do with MyChart, go to [email or web address].    Your next appointment:   2 week(s)  The format for your next appointment:   In Person  Provider:   Thomasene Ripple, DO [address], [place], [place] [address]  Other Instructions

## 2020-11-14 NOTE — Progress Notes (Signed)
Cardiology Office Note:    Date:  11/14/2020   ID:  Kimberly, Bullock [birth year], MRN [id number]  PCP:  Hart Carwin, MD (Inactive)  Cardiologist:  Thomasene Ripple, DO  Electrophysiologist:  None   Referring MD: No ref. provider found   " I am doing well"    History of Present Illness:    Kimberly Bullock is a 75 y.o. female with a  hx of  coronary artery disease status post coronary artery bypass grafting many years ago in [place] type 2 diabetes, hypertension, obesity, hyperlipidemia presents today for follow-up visit.  I saw the patient on Jul 03, 2020 at that visit she had complained that she was experiencing shortness of breath and chest discomfort.  During that time I really wanted for the patient to undergo left heart catheterization but she had a flight scheduled the next day to [place] Since her visit she had not been able to start the Ranexa that was given to her.  I saw the patient on September 27, 2020 she was still experiencing chest discomfort and shortness of breath therefore we did schedule her for heart catheterization.  Initially the patient and her husband agreed and she was scheduled for a heart cath.  The next day we cannot an impromptu visit from the patient husband reporting that she was having significant anxiety and wanted to cancel the heart catheterization because her granddaughter was visiting from [place]  She is here today for follow-up visit.  She still reports that she is experiencing chest discomfort.  And also admits to shortness of breath.  She tells me she has been taking Ranexa.  The patient speaks [place] therefore this visit was facilitated by an interpreter (interpreter [id number] [place]).  Past Medical History:  Diagnosis Date   Diabetes mellitus without complication (HCC)    Hypertension     Past Surgical History:  Procedure Laterality Date   ANAL FISTULOTOMY  1968   CORONARY ARTERY BYPASS GRAFT  2017   Done in [place]   FIXATION  KYPHOPLASTY LUMBAR SPINE  2015    Current Medications: Current Meds  Medication Sig   Ascorbic Acid (VITAMIN C) 1000 MG tablet Take 1,000 mg by mouth daily.   aspirin EC 81 MG tablet Take 81 mg by mouth daily. Swallow whole.   levothyroxine (SYNTHROID) 100 MCG tablet Take 100 mcg by mouth daily before breakfast.   magnesium oxide (MAG-OX) 400 MG tablet Take 400 mg by mouth daily.   metFORMIN (GLUCOPHAGE) 850 MG tablet Take 850 mg by mouth 2 (two) times daily with a meal.   PRESCRIPTION MEDICATION Take 10 mg by mouth daily. Areta- blood pressure med   PRESCRIPTION MEDICATION Place 1 drop into both eyes daily. Oftaquix 5 mg eye drops   rosuvastatin (CRESTOR) 10 MG tablet Take 10 mg by mouth daily.   telmisartan-hydrochlorothiazide (MICARDIS HCT) 80-12.5 MG tablet Take 1 tablet by mouth daily.   Zinc Gluconate 15 MG TABS Take 15 mg by mouth daily.     Allergies:   Patient has no known allergies.   Social History   Socioeconomic History   Marital status: Married    Spouse name: Not on file   Number of children: Not on file   Years of education: Not on file   Highest education level: Not on file  Occupational History   Not on file  Tobacco Use   Smoking status: Every Day    Types: Cigarettes   Smokeless tobacco: Never  Substance and Sexual Activity  Alcohol use: No    Alcohol/week: 0.0 standard drinks   Drug use: No   Sexual activity: Not on file  Other Topics Concern   Not on file  Social History Narrative   Not on file   Social Determinants of Health   Financial Resource Strain: Not on file  Food Insecurity: Not on file  Transportation Needs: Not on file  Physical Activity: Not on file  Stress: Not on file  Social Connections: Not on file     Family History: The patient's family history includes Diabetes in an other family member; Hypertension in an other family member. There is no history of Cancer.  ROS:   Review of Systems  Constitution: Negative for  decreased appetite, fever and weight gain.  HENT: Negative for congestion, ear discharge, hoarse voice and sore throat.   Eyes: Negative for discharge, redness, vision loss in right eye and visual halos.  Cardiovascular: Negative for chest pain, dyspnea on exertion, leg swelling, orthopnea and palpitations.  Respiratory: Negative for cough, hemoptysis, shortness of breath and snoring.   Endocrine: Negative for heat intolerance and polyphagia.  Hematologic/Lymphatic: Negative for bleeding problem. Does not bruise/bleed easily.  Skin: Negative for flushing, nail changes, rash and suspicious lesions.  Musculoskeletal: Negative for arthritis, joint pain, muscle cramps, myalgias, neck pain and stiffness.  Gastrointestinal: Negative for abdominal pain, bowel incontinence, diarrhea and excessive appetite.  Genitourinary: Negative for decreased libido, genital sores and incomplete emptying.  Neurological: Negative for brief paralysis, focal weakness, headaches and loss of balance.  Psychiatric/Behavioral: Negative for altered mental status, depression and suicidal ideas.  Allergic/Immunologic: Negative for HIV exposure and persistent infections.    EKGs/Labs/Other Studies Reviewed:    The following studies were reviewed today:   EKG:  The ekg ordered today demonstrates   Recent Labs: 09/27/2020: BUN 23; Creatinine, Ser 1.00; Hemoglobin 13.5; Magnesium 2.0; Platelets 301; Potassium 5.0; Sodium 140  Recent Lipid Panel No results found for: CHOL, TRIG, HDL, CHOLHDL, VLDL, LDLCALC, LDLDIRECT  Physical Exam:    VS:  BP 100/60 (BP Location: Right Arm)   Pulse 84   Ht 5\' 1"  (1.549 m)   Wt 186 lb 9.6 oz (84.6 kg)   SpO2 97%   BMI 35.26 kg/m     Wt Readings from Last 3 Encounters:  11/14/20 186 lb 9.6 oz (84.6 kg)  09/27/20 188 lb (85.3 kg)  07/03/20 196 lb (88.9 kg)     GEN: Well nourished, well developed in no acute distress HEENT: Normal NECK: No JVD; No carotid bruits LYMPHATICS: No  lymphadenopathy CARDIAC: S1S2 noted,RRR, no murmurs, rubs, gallops RESPIRATORY:  Clear to auscultation without rales, wheezing or rhonchi  ABDOMEN: Soft, non-tender, non-distended, +bowel sounds, no guarding. EXTREMITIES: No edema, No cyanosis, no clubbing MUSCULOSKELETAL:  No deformity  SKIN: Warm and dry NEUROLOGIC:  Alert and oriented x 3, non-focal PSYCHIATRIC:  Normal affect, good insight  ASSESSMENT:    1. Coronary artery disease of native artery of native heart with stable angina pectoris (HCC)   2. Primary hypertension   3. Diabetes mellitus without complication (HCC)   4. Mixed hyperlipidemia   5. Obesity (BMI 30-39.9)    PLAN:     She still has shortness of breath and exertion which I do believe this is her anginal equivalent.-She has high risk for worsening coronary artery disease.  At this time the appropriate testing will be to pursue with a left heart catheterization in this patient.  Via interpreter I spoke to the patient husband  about this test and they are agreeable to proceed. The patient understands that risks include but are not limited to stroke (1 in 1000), death (1 in 1000), kidney failure [usually temporary] (1 in 500), bleeding (1 in 200), allergic reaction [possibly serious] (1 in 200), and agrees to proceed. Continue aspirin and statin.  She did not start her Ranexa we will going to send this to the pharmacy again.   Her diabetes is being managed by her primary care provider.     Blood pressure is acceptable, continue with current antihypertensive regimen.   Hyperlipidemia - continue with current statin medication.   The patient understands the need to lose weight with diet and exercise. We have discussed specific strategies for this.     The patient is in agreement with the above plan. The patient left the office in stable condition.  The patient will follow up in 2 weeks post cath.   Medication Adjustments/Labs and Tests Ordered: Current medicines  are reviewed at length with the patient today.  Concerns regarding medicines are outlined above.  Orders Placed This Encounter  Procedures   Basic metabolic panel   Magnesium   CBC with Differential/Platelet   No orders of the defined types were placed in this encounter.   Patient Instructions  Medication Instructions:  Your physician recommends that you continue on your current medications as directed. Please refer to the Current Medication list given to you today.  *If you need a refill on your cardiac medications before your next appointment, please call your pharmacy*   Lab Work: Your physician recommends that you return for lab work in:  Next week: BMET, Mag, CBC If you have labs (blood work) drawn today and your tests are completely normal, you will receive your results only by: MyChart Message (if you have MyChart) OR A paper copy in the mail If you have any lab test that is abnormal or we need to change your treatment, we will call you to review the results.   Testing/Procedures:  [facility] [address] [place] [address] Dept: [phone] Loc: [phone]  Zhania Shaheen  11/14/2020  You are scheduled for a Cardiac Catheterization on Monday, October 17 with Dr. Nanetta Batty.  1. Please arrive at the [facility] (Main Entrance A) at [facility]: [address] [place], [place] [address] at 9:30 AM (This time is two hours before your procedure to ensure your preparation). Free valet parking service is available.   Special note: Every effort is made to have your procedure done on time. Please understand that emergencies sometimes delay scheduled procedures.  2. Diet: Do not eat solid foods after midnight.  The patient may have clear liquids until 5am upon the day of the procedure.  3. Labs: You will need to have blood drawn on NEXT WEEK  4. Medication  instructions in preparation for your procedure:   Contrast Allergy: No  Do not take Diabetes Med Glucophage (Metformin) on the day of the procedure and HOLD 48 HOURS AFTER THE PROCEDURE.  On the morning of your procedure, take your Aspirin and any morning medicines NOT listed above.  You may use sips of water.  5. Plan for one night stay--bring personal belongings. 6. Bring a current list of your medications and current insurance cards. 7. You MUST have a responsible person to drive you home. 8. Someone MUST be with you the first 24 hours after you arrive home  or your discharge will be delayed. 9. Please wear clothes that are easy to get on and off and wear slip-on shoes.  Thank you for allowing [place] to care for you!   -- [facility]    Follow-Up: At [facility], you and your health needs are our priority.  As part of our continuing mission to provide you with exceptional heart care, we have created designated Provider Care Teams.  These Care Teams include your primary Cardiologist (physician) and Advanced Practice Providers (APPs -  Physician Assistants and Nurse Practitioners) who all work together to provide you with the care you need, when you need it.  We recommend signing up for the patient portal called "MyChart".  Sign up information is provided on this After Visit Summary.  MyChart is used to connect with patients for Virtual Visits (Telemedicine).  Patients are able to view lab/test results, encounter notes, upcoming appointments, etc.  Non-urgent messages can be sent to your provider as well.   To learn more about what you can do with MyChart, go to [email or web address].    Your next appointment:   2 week(s)  The format for your next appointment:   In Person  Provider:   Thomasene Ripple, DO [address], [place], [place] [address]    Other Instructions     Adopting a Healthy Lifestyle.  Know what a healthy weight is for you  (roughly BMI <25) and aim to maintain this   Aim for 7+ servings of fruits and vegetables daily   65-80+ fluid ounces of water or unsweet tea for healthy kidneys   Limit to max 1 drink of alcohol per day; avoid smoking/tobacco   Limit animal fats in diet for cholesterol and heart health - choose grass fed whenever available   Avoid highly processed foods, and foods high in saturated/trans fats   Aim for low stress - take time to unwind and care for your mental health   Aim for 150 min of moderate intensity exercise weekly for heart health, and weights twice weekly for bone health   Aim for 7-9 hours of sleep daily   When it comes to diets, agreement about the perfect plan isnt easy to find, even among the experts. Experts at the [facility] [employer] developed an idea known as the Healthy Eating Plate. Just imagine a plate divided into logical, healthy portions.   The emphasis is on diet quality:   Load up on vegetables and fruits - one-half of your plate: Aim for color and variety, and remember that potatoes dont count.   Go for whole grains - one-quarter of your plate: Whole wheat, barley, wheat berries, quinoa, oats, brown rice, and foods made with them. If you want pasta, go with whole wheat pasta.   Protein power - one-quarter of your plate: Fish, chicken, beans, and nuts are all healthy, versatile protein sources. Limit red meat.   The diet, however, does go beyond the plate, offering a few other suggestions.   Use healthy plant oils, such as olive, canola, soy, corn, sunflower and peanut. Check the labels, and avoid partially hydrogenated oil, which have unhealthy trans fats.   If youre thirsty, drink water. Coffee and tea are good in moderation, but skip sugary drinks and limit milk and dairy products to one or two daily servings.   The type of carbohydrate in the diet is more important than the amount. Some sources of carbohydrates, such as vegetables, fruits,  whole grains,  and beans-are healthier than others.   Finally, stay active  Signed, Thomasene Ripple, DO  11/14/2020 9:38 PM     [facility]

## 2020-11-14 NOTE — H&P (View-Only) (Signed)
Cardiology Office Note:    Date:  11/14/2020   ID:  Kimberly, Bullock [birth year], MRN [id number]  PCP:  Hart Carwin, MD (Inactive)  Cardiologist:  Thomasene Ripple, DO  Electrophysiologist:  None   Referring MD: No ref. provider found   " I am doing well"    History of Present Illness:    Kimberly Bullock is a 75 y.o. female with a  hx of  coronary artery disease status post coronary artery bypass grafting many years ago in [place] type 2 diabetes, hypertension, obesity, hyperlipidemia presents today for follow-up visit.  I saw the patient on Jul 03, 2020 at that visit she had complained that she was experiencing shortness of breath and chest discomfort.  During that time I really wanted for the patient to undergo left heart catheterization but she had a flight scheduled the next day to [place] Since her visit she had not been able to start the Ranexa that was given to her.  I saw the patient on September 27, 2020 she was still experiencing chest discomfort and shortness of breath therefore we did schedule her for heart catheterization.  Initially the patient and her husband agreed and she was scheduled for a heart cath.  The next day we cannot an impromptu visit from the patient husband reporting that she was having significant anxiety and wanted to cancel the heart catheterization because her granddaughter was visiting from [place]  She is here today for follow-up visit.  She still reports that she is experiencing chest discomfort.  And also admits to shortness of breath.  She tells me she has been taking Ranexa.  The patient speaks [place] therefore this visit was facilitated by an interpreter (interpreter [id number] [place]).  Past Medical History:  Diagnosis Date   Diabetes mellitus without complication (HCC)    Hypertension     Past Surgical History:  Procedure Laterality Date   ANAL FISTULOTOMY  1968   CORONARY ARTERY BYPASS GRAFT  2017   Done in [place]   FIXATION  KYPHOPLASTY LUMBAR SPINE  2015    Current Medications: Current Meds  Medication Sig   Ascorbic Acid (VITAMIN C) 1000 MG tablet Take 1,000 mg by mouth daily.   aspirin EC 81 MG tablet Take 81 mg by mouth daily. Swallow whole.   levothyroxine (SYNTHROID) 100 MCG tablet Take 100 mcg by mouth daily before breakfast.   magnesium oxide (MAG-OX) 400 MG tablet Take 400 mg by mouth daily.   metFORMIN (GLUCOPHAGE) 850 MG tablet Take 850 mg by mouth 2 (two) times daily with a meal.   PRESCRIPTION MEDICATION Take 10 mg by mouth daily. Areta- blood pressure med   PRESCRIPTION MEDICATION Place 1 drop into both eyes daily. Oftaquix 5 mg eye drops   rosuvastatin (CRESTOR) 10 MG tablet Take 10 mg by mouth daily.   telmisartan-hydrochlorothiazide (MICARDIS HCT) 80-12.5 MG tablet Take 1 tablet by mouth daily.   Zinc Gluconate 15 MG TABS Take 15 mg by mouth daily.     Allergies:   Patient has no known allergies.   Social History   Socioeconomic History   Marital status: Married    Spouse name: Not on file   Number of children: Not on file   Years of education: Not on file   Highest education level: Not on file  Occupational History   Not on file  Tobacco Use   Smoking status: Every Day    Types: Cigarettes   Smokeless tobacco: Never  Substance and Sexual Activity  Alcohol use: No    Alcohol/week: 0.0 standard drinks   Drug use: No   Sexual activity: Not on file  Other Topics Concern   Not on file  Social History Narrative   Not on file   Social Determinants of Health   Financial Resource Strain: Not on file  Food Insecurity: Not on file  Transportation Needs: Not on file  Physical Activity: Not on file  Stress: Not on file  Social Connections: Not on file     Family History: The patient's family history includes Diabetes in an other family member; Hypertension in an other family member. There is no history of Cancer.  ROS:   Review of Systems  Constitution: Negative for  decreased appetite, fever and weight gain.  HENT: Negative for congestion, ear discharge, hoarse voice and sore throat.   Eyes: Negative for discharge, redness, vision loss in right eye and visual halos.  Cardiovascular: Negative for chest pain, dyspnea on exertion, leg swelling, orthopnea and palpitations.  Respiratory: Negative for cough, hemoptysis, shortness of breath and snoring.   Endocrine: Negative for heat intolerance and polyphagia.  Hematologic/Lymphatic: Negative for bleeding problem. Does not bruise/bleed easily.  Skin: Negative for flushing, nail changes, rash and suspicious lesions.  Musculoskeletal: Negative for arthritis, joint pain, muscle cramps, myalgias, neck pain and stiffness.  Gastrointestinal: Negative for abdominal pain, bowel incontinence, diarrhea and excessive appetite.  Genitourinary: Negative for decreased libido, genital sores and incomplete emptying.  Neurological: Negative for brief paralysis, focal weakness, headaches and loss of balance.  Psychiatric/Behavioral: Negative for altered mental status, depression and suicidal ideas.  Allergic/Immunologic: Negative for HIV exposure and persistent infections.    EKGs/Labs/Other Studies Reviewed:    The following studies were reviewed today:   EKG:  The ekg ordered today demonstrates   Recent Labs: 09/27/2020: BUN 23; Creatinine, Ser 1.00; Hemoglobin 13.5; Magnesium 2.0; Platelets 301; Potassium 5.0; Sodium 140  Recent Lipid Panel No results found for: CHOL, TRIG, HDL, CHOLHDL, VLDL, LDLCALC, LDLDIRECT  Physical Exam:    VS:  BP 100/60 (BP Location: Right Arm)   Pulse 84   Ht 5' 1" (1.549 m)   Wt 186 lb 9.6 oz (84.6 kg)   SpO2 97%   BMI 35.26 kg/m     Wt Readings from Last 3 Encounters:  11/14/20 186 lb 9.6 oz (84.6 kg)  09/27/20 188 lb (85.3 kg)  07/03/20 196 lb (88.9 kg)     GEN: Well nourished, well developed in no acute distress HEENT: Normal NECK: No JVD; No carotid bruits LYMPHATICS: No  lymphadenopathy CARDIAC: S1S2 noted,RRR, no murmurs, rubs, gallops RESPIRATORY:  Clear to auscultation without rales, wheezing or rhonchi  ABDOMEN: Soft, non-tender, non-distended, +bowel sounds, no guarding. EXTREMITIES: No edema, No cyanosis, no clubbing MUSCULOSKELETAL:  No deformity  SKIN: Warm and dry NEUROLOGIC:  Alert and oriented x 3, non-focal PSYCHIATRIC:  Normal affect, good insight  ASSESSMENT:    1. Coronary artery disease of native artery of native heart with stable angina pectoris (HCC)   2. Primary hypertension   3. Diabetes mellitus without complication (HCC)   4. Mixed hyperlipidemia   5. Obesity (BMI 30-39.9)    PLAN:     She still has shortness of breath and exertion which I do believe this is her anginal equivalent.-She has high risk for worsening coronary artery disease.  At this time the appropriate testing will be to pursue with a left heart catheterization in this patient.  Via interpreter I spoke to the patient husband   about this test and they are agreeable to proceed. The patient understands that risks include but are not limited to stroke (1 in 1000), death (1 in 1000), kidney failure [usually temporary] (1 in 500), bleeding (1 in 200), allergic reaction [possibly serious] (1 in 200), and agrees to proceed. Continue aspirin and statin.  She did not start her Ranexa we will going to send this to the pharmacy again.   Her diabetes is being managed by her primary care provider.     Blood pressure is acceptable, continue with current antihypertensive regimen.   Hyperlipidemia - continue with current statin medication.   The patient understands the need to lose weight with diet and exercise. We have discussed specific strategies for this.     The patient is in agreement with the above plan. The patient left the office in stable condition.  The patient will follow up in 2 weeks post cath.   Medication Adjustments/Labs and Tests Ordered: Current medicines  are reviewed at length with the patient today.  Concerns regarding medicines are outlined above.  Orders Placed This Encounter  Procedures   Basic metabolic panel   Magnesium   CBC with Differential/Platelet   No orders of the defined types were placed in this encounter.   Patient Instructions  Medication Instructions:  Your physician recommends that you continue on your current medications as directed. Please refer to the Current Medication list given to you today.  *If you need a refill on your cardiac medications before your next appointment, please call your pharmacy*   Lab Work: Your physician recommends that you return for lab work in:  Next week: BMET, Mag, CBC If you have labs (blood work) drawn today and your tests are completely normal, you will receive your results only by: MyChart Message (if you have MyChart) OR A paper copy in the mail If you have any lab test that is abnormal or we need to change your treatment, we will call you to review the results.   Testing/Procedures:  [facility] [address] [place] [address] Dept: [phone] Loc: [phone]  Zhania Shaheen  11/14/2020  You are scheduled for a Cardiac Catheterization on Monday, October 17 with Dr. Nanetta Batty.  1. Please arrive at the [facility] (Main Entrance A) at [facility]: [address] [place], [place] [address] at 9:30 AM (This time is two hours before your procedure to ensure your preparation). Free valet parking service is available.   Special note: Every effort is made to have your procedure done on time. Please understand that emergencies sometimes delay scheduled procedures.  2. Diet: Do not eat solid foods after midnight.  The patient may have clear liquids until 5am upon the day of the procedure.  3. Labs: You will need to have blood drawn on NEXT WEEK  4. Medication  instructions in preparation for your procedure:   Contrast Allergy: No  Do not take Diabetes Med Glucophage (Metformin) on the day of the procedure and HOLD 48 HOURS AFTER THE PROCEDURE.  On the morning of your procedure, take your Aspirin and any morning medicines NOT listed above.  You may use sips of water.  5. Plan for one night stay--bring personal belongings. 6. Bring a current list of your medications and current insurance cards. 7. You MUST have a responsible person to drive you home. 8. Someone MUST be with you the first 24 hours after you arrive home  or your discharge will be delayed. 9. Please wear clothes that are easy to get on and off and wear slip-on shoes.  Thank you for allowing [place] to care for you!   -- [facility]    Follow-Up: At [facility], you and your health needs are our priority.  As part of our continuing mission to provide you with exceptional heart care, we have created designated Provider Care Teams.  These Care Teams include your primary Cardiologist (physician) and Advanced Practice Providers (APPs -  Physician Assistants and Nurse Practitioners) who all work together to provide you with the care you need, when you need it.  We recommend signing up for the patient portal called "MyChart".  Sign up information is provided on this After Visit Summary.  MyChart is used to connect with patients for Virtual Visits (Telemedicine).  Patients are able to view lab/test results, encounter notes, upcoming appointments, etc.  Non-urgent messages can be sent to your provider as well.   To learn more about what you can do with MyChart, go to [email or web address].    Your next appointment:   2 week(s)  The format for your next appointment:   In Person  Provider:   Thomasene Ripple, DO [address], [place], [place] [address]    Other Instructions     Adopting a Healthy Lifestyle.  Know what a healthy weight is for you  (roughly BMI <25) and aim to maintain this   Aim for 7+ servings of fruits and vegetables daily   65-80+ fluid ounces of water or unsweet tea for healthy kidneys   Limit to max 1 drink of alcohol per day; avoid smoking/tobacco   Limit animal fats in diet for cholesterol and heart health - choose grass fed whenever available   Avoid highly processed foods, and foods high in saturated/trans fats   Aim for low stress - take time to unwind and care for your mental health   Aim for 150 min of moderate intensity exercise weekly for heart health, and weights twice weekly for bone health   Aim for 7-9 hours of sleep daily   When it comes to diets, agreement about the perfect plan isnt easy to find, even among the experts. Experts at the [facility] [employer] developed an idea known as the Healthy Eating Plate. Just imagine a plate divided into logical, healthy portions.   The emphasis is on diet quality:   Load up on vegetables and fruits - one-half of your plate: Aim for color and variety, and remember that potatoes dont count.   Go for whole grains - one-quarter of your plate: Whole wheat, barley, wheat berries, quinoa, oats, brown rice, and foods made with them. If you want pasta, go with whole wheat pasta.   Protein power - one-quarter of your plate: Fish, chicken, beans, and nuts are all healthy, versatile protein sources. Limit red meat.   The diet, however, does go beyond the plate, offering a few other suggestions.   Use healthy plant oils, such as olive, canola, soy, corn, sunflower and peanut. Check the labels, and avoid partially hydrogenated oil, which have unhealthy trans fats.   If youre thirsty, drink water. Coffee and tea are good in moderation, but skip sugary drinks and limit milk and dairy products to one or two daily servings.   The type of carbohydrate in the diet is more important than the amount. Some sources of carbohydrates, such as vegetables, fruits,  whole grains,  and beans-are healthier than others.   Finally, stay active  Signed, Thomasene Ripple, DO  11/14/2020 9:38 PM     [facility]

## 2020-11-19 DIAGNOSIS — I25118 Atherosclerotic heart disease of native coronary artery with other forms of angina pectoris: Secondary | ICD-10-CM | POA: Diagnosis not present

## 2020-11-19 LAB — CBC WITH DIFFERENTIAL/PLATELET
Basophils Absolute: 0.1 10*3/uL (ref 0.0–0.2)
Basos: 1 %
EOS (ABSOLUTE): 0.2 10*3/uL (ref 0.0–0.4)
Eos: 2 %
Hematocrit: 36.7 % (ref 34.0–46.6)
Hemoglobin: 12.7 g/dL (ref 11.1–15.9)
Immature Grans (Abs): 0 10*3/uL (ref 0.0–0.1)
Immature Granulocytes: 0 %
Lymphocytes Absolute: 2.3 10*3/uL (ref 0.7–3.1)
Lymphs: 19 %
MCH: 31.4 pg (ref 26.6–33.0)
MCHC: 34.6 g/dL (ref 31.5–35.7)
MCV: 91 fL (ref 79–97)
Monocytes Absolute: 0.9 10*3/uL (ref 0.1–0.9)
Monocytes: 8 %
Neutrophils Absolute: 8.2 10*3/uL — ABNORMAL HIGH (ref 1.4–7.0)
Neutrophils: 70 %
Platelets: 311 10*3/uL (ref 150–450)
RBC: 4.04 x10E6/uL (ref 3.77–5.28)
RDW: 12.8 % (ref 11.7–15.4)
WBC: 11.7 10*3/uL — ABNORMAL HIGH (ref 3.4–10.8)

## 2020-11-19 LAB — BASIC METABOLIC PANEL
BUN/Creatinine Ratio: 27 (ref 12–28)
BUN: 31 mg/dL — ABNORMAL HIGH (ref 8–27)
CO2: 20 mmol/L (ref 20–29)
Calcium: 10 mg/dL (ref 8.7–10.3)
Chloride: 102 mmol/L (ref 96–106)
Creatinine, Ser: 1.14 mg/dL — ABNORMAL HIGH (ref 0.57–1.00)
Glucose: 130 mg/dL — ABNORMAL HIGH (ref 70–99)
Potassium: 5.3 mmol/L — ABNORMAL HIGH (ref 3.5–5.2)
Sodium: 140 mmol/L (ref 134–144)
eGFR: 51 mL/min/{1.73_m2} — ABNORMAL LOW (ref >59)

## 2020-11-19 LAB — MAGNESIUM: Magnesium: 1.8 mg/dL (ref 1.6–2.3)

## 2020-11-20 ENCOUNTER — Other Ambulatory Visit: Payer: Self-pay

## 2020-11-20 ENCOUNTER — Telehealth: Payer: Self-pay | Admitting: Cardiovascular Disease

## 2020-11-20 DIAGNOSIS — Z79899 Other long term (current) drug therapy: Secondary | ICD-10-CM

## 2020-11-20 DIAGNOSIS — R7989 Other specified abnormal findings of blood chemistry: Secondary | ICD-10-CM

## 2020-11-20 NOTE — Telephone Encounter (Signed)
Patient's husband is calling wanting to see if his wife's procedure can be rescheduled to 11/23/20. If not they will leave it for Monday, but Friday would work better. If he does not answer when calling back please leave a VM.

## 2020-11-20 NOTE — Telephone Encounter (Signed)
The patient and husband called requesting that the cardiac cath be rescheduled for Friday 10/14. It has been changed to Friday at 9 am with Dr. [place]. The patient's husband has been made aware to have the patient there by 7 am.

## 2020-11-22 ENCOUNTER — Telehealth: Payer: Self-pay | Admitting: *Deleted

## 2020-11-22 NOTE — Telephone Encounter (Addendum)
Cardiac catheterization scheduled at [facility] for:  Friday November 23, 2020 9 AM Arrive [facility] Main Entrance A [facility]) at: 6:30 AM-needs lab   No solid food after midnight prior to cath, clear liquids until 5 AM day of procedure.  Medication instructions: Hold: Metformin-none 11/23/20 until post procedure Telmisartan-HCTZ-AM of procedure  Except hold medications usual morning medications can be taken pre-cath with sips of water including: - aspirin 81 mg    Confirmed patient has responsible adult to drive home post procedure and be with patient first 24 hours after arriving home.  [facility] does allow one visitor to accompany you and wait in the hospital waiting room while you are there for your procedure. You and your visitor will be asked to wear a mask once you enter the hospital.   Patient reports does not currently have any symptoms concerning for COVID-19 and no household members with COVID-19 like illness.        [address] [place] [id number] -reviewed procedure/mask/visitor instructions with patient and patient's husband.

## 2020-11-23 ENCOUNTER — Encounter (HOSPITAL_COMMUNITY): Payer: Self-pay | Admitting: Cardiology

## 2020-11-23 ENCOUNTER — Other Ambulatory Visit: Payer: Self-pay

## 2020-11-23 ENCOUNTER — Ambulatory Visit (HOSPITAL_COMMUNITY)
Admission: RE | Disposition: A | Discharge status: Home / Self Care | Payer: Self-pay | Source: Home / Self Care | Attending: Cardiology

## 2020-11-23 ENCOUNTER — Ambulatory Visit (HOSPITAL_COMMUNITY)
Admission: RE | Admit: 2020-11-23 | Discharge: 2020-11-23 | Disposition: A | Discharge status: Home / Self Care | Payer: Medicare HMO | Attending: Cardiology | Admitting: Cardiology

## 2020-11-23 DIAGNOSIS — Z951 Presence of aortocoronary bypass graft: Secondary | ICD-10-CM | POA: Insufficient documentation

## 2020-11-23 DIAGNOSIS — F17210 Nicotine dependence, cigarettes, uncomplicated: Secondary | ICD-10-CM | POA: Insufficient documentation

## 2020-11-23 DIAGNOSIS — E119 Type 2 diabetes mellitus without complications: Secondary | ICD-10-CM

## 2020-11-23 DIAGNOSIS — E669 Obesity, unspecified: Secondary | ICD-10-CM | POA: Diagnosis present

## 2020-11-23 DIAGNOSIS — E782 Mixed hyperlipidemia: Secondary | ICD-10-CM | POA: Diagnosis not present

## 2020-11-23 DIAGNOSIS — Z833 Family history of diabetes mellitus: Secondary | ICD-10-CM | POA: Insufficient documentation

## 2020-11-23 DIAGNOSIS — Z7984 Long term (current) use of oral hypoglycemic drugs: Secondary | ICD-10-CM | POA: Insufficient documentation

## 2020-11-23 DIAGNOSIS — I209 Angina pectoris, unspecified: Secondary | ICD-10-CM | POA: Diagnosis present

## 2020-11-23 DIAGNOSIS — Z6835 Body mass index (BMI) 35.0-35.9, adult: Secondary | ICD-10-CM | POA: Diagnosis not present

## 2020-11-23 DIAGNOSIS — I25119 Atherosclerotic heart disease of native coronary artery with unspecified angina pectoris: Secondary | ICD-10-CM

## 2020-11-23 DIAGNOSIS — I10 Essential (primary) hypertension: Secondary | ICD-10-CM | POA: Diagnosis not present

## 2020-11-23 DIAGNOSIS — I25118 Atherosclerotic heart disease of native coronary artery with other forms of angina pectoris: Secondary | ICD-10-CM | POA: Diagnosis not present

## 2020-11-23 DIAGNOSIS — Z7982 Long term (current) use of aspirin: Secondary | ICD-10-CM | POA: Diagnosis not present

## 2020-11-23 DIAGNOSIS — I2582 Chronic total occlusion of coronary artery: Secondary | ICD-10-CM | POA: Insufficient documentation

## 2020-11-23 DIAGNOSIS — R0602 Shortness of breath: Secondary | ICD-10-CM | POA: Insufficient documentation

## 2020-11-23 DIAGNOSIS — Z8249 Family history of ischemic heart disease and other diseases of the circulatory system: Secondary | ICD-10-CM | POA: Diagnosis not present

## 2020-11-23 DIAGNOSIS — I25810 Atherosclerosis of coronary artery bypass graft(s) without angina pectoris: Secondary | ICD-10-CM | POA: Diagnosis present

## 2020-11-23 HISTORY — PX: LEFT HEART CATH AND CORS/GRAFTS ANGIOGRAPHY: CATH118250

## 2020-11-23 LAB — BASIC METABOLIC PANEL
Anion gap: 10 (ref 5–15)
BUN: 24 mg/dL — ABNORMAL HIGH (ref 8–23)
CO2: 23 mmol/L (ref 22–32)
Calcium: 9.4 mg/dL (ref 8.9–10.3)
Chloride: 105 mmol/L (ref 98–111)
Creatinine, Ser: 1 mg/dL (ref 0.44–1.00)
GFR, Estimated: 59 mL/min — ABNORMAL LOW (ref >60)
Glucose, Bld: 155 mg/dL — ABNORMAL HIGH (ref 70–99)
Potassium: 4.1 mmol/L (ref 3.5–5.1)
Sodium: 138 mmol/L (ref 135–145)

## 2020-11-23 LAB — GLUCOSE, CAPILLARY: Glucose-Capillary: 159 mg/dL — ABNORMAL HIGH (ref 70–99)

## 2020-11-23 SURGERY — LEFT HEART CATH AND CORS/GRAFTS ANGIOGRAPHY
Anesthesia: LOCAL

## 2020-11-23 MED ORDER — SODIUM CHLORIDE 0.9 % WEIGHT BASED INFUSION: 1.0000 mL/kg/h | INTRAVENOUS | Status: DC

## 2020-11-23 MED ORDER — SODIUM CHLORIDE 0.9% FLUSH: 3.0000 mL | Freq: Two times a day (BID) | INTRAVENOUS | Status: DC

## 2020-11-23 MED ORDER — IOHEXOL 350 MG/ML SOLN
INTRAVENOUS | Status: DC | PRN
  Administered 2020-11-23: 50 mL

## 2020-11-23 MED ORDER — FENTANYL CITRATE (PF) 100 MCG/2ML IJ SOLN
INTRAMUSCULAR | Status: AC
  Filled 2020-11-23: qty 2

## 2020-11-23 MED ORDER — SODIUM CHLORIDE 0.9 % WEIGHT BASED INFUSION
3.0000 mL/kg/h | INTRAVENOUS | Status: AC
  Administered 2020-11-23: 3 mL/kg/h via INTRAVENOUS

## 2020-11-23 MED ORDER — METFORMIN HCL 850 MG PO TABS: 850.0000 mg | ORAL_TABLET | Freq: Two times a day (BID) | ORAL | Status: AC

## 2020-11-23 MED ORDER — HEPARIN (PORCINE) IN NACL 1000-0.9 UT/500ML-% IV SOLN
INTRAVENOUS | Status: AC
  Filled 2020-11-23: qty 1000

## 2020-11-23 MED ORDER — MIDAZOLAM HCL 2 MG/2ML IJ SOLN
INTRAMUSCULAR | Status: DC | PRN
  Administered 2020-11-23: 1 mg via INTRAVENOUS

## 2020-11-23 MED ORDER — SODIUM CHLORIDE 0.9% FLUSH: 3.0000 mL | INTRAVENOUS | Status: DC | PRN

## 2020-11-23 MED ORDER — LIDOCAINE HCL (PF) 1 % IJ SOLN
INTRAMUSCULAR | Status: AC
  Filled 2020-11-23: qty 30

## 2020-11-23 MED ORDER — MIDAZOLAM HCL 2 MG/2ML IJ SOLN
INTRAMUSCULAR | Status: AC
  Filled 2020-11-23: qty 2

## 2020-11-23 MED ORDER — VERAPAMIL HCL 2.5 MG/ML IV SOLN
INTRAVENOUS | Status: AC
  Filled 2020-11-23: qty 2

## 2020-11-23 MED ORDER — SODIUM CHLORIDE 0.9 % IV SOLN: 250.0000 mL | INTRAVENOUS | Status: DC | PRN

## 2020-11-23 MED ORDER — FENTANYL CITRATE (PF) 100 MCG/2ML IJ SOLN
INTRAMUSCULAR | Status: DC | PRN
  Administered 2020-11-23: 25 ug via INTRAVENOUS

## 2020-11-23 MED ORDER — LIDOCAINE HCL (PF) 1 % IJ SOLN
INTRAMUSCULAR | Status: DC | PRN
  Administered 2020-11-23: 15 mL

## 2020-11-23 MED ORDER — ONDANSETRON HCL 4 MG/2ML IJ SOLN: 4.0000 mg | Freq: Four times a day (QID) | INTRAMUSCULAR | Status: DC | PRN

## 2020-11-23 MED ORDER — ACETAMINOPHEN 325 MG PO TABS: 650.0000 mg | ORAL_TABLET | ORAL | Status: DC | PRN

## 2020-11-23 MED ORDER — ASPIRIN 81 MG PO CHEW: 81.0000 mg | CHEWABLE_TABLET | ORAL | Status: DC

## 2020-11-23 MED ORDER — HEPARIN (PORCINE) IN NACL 1000-0.9 UT/500ML-% IV SOLN
INTRAVENOUS | Status: DC | PRN
  Administered 2020-11-23 (×2): 500 mL

## 2020-11-23 SURGICAL SUPPLY — 12 items
CATH INFINITI 5 FR IM (CATHETERS) ×1 IMPLANT
CATH INFINITI 5FR MULTPACK ANG (CATHETERS) ×1 IMPLANT
CLOSURE MYNX CONTROL 5F (Vascular Products) ×1 IMPLANT
KIT HEART LEFT (KITS) ×2 IMPLANT
PACK CARDIAC CATHETERIZATION (CUSTOM PROCEDURE TRAY) ×2 IMPLANT
SHEATH PINNACLE 5F 10CM (SHEATH) ×1 IMPLANT
SHEATH PROBE COVER 6X72 (BAG) ×1 IMPLANT
SYR MEDRAD MARK 7 150ML (SYRINGE) ×2 IMPLANT
TRANSDUCER W/STOPCOCK (MISCELLANEOUS) ×2 IMPLANT
TUBING CIL FLEX 10 FLL-RA (TUBING) ×2 IMPLANT
WIRE EMERALD 3MM-J .035X260CM (WIRE) ×1 IMPLANT
WIRE J 3MM .035X145CM (WIRE) ×1 IMPLANT

## 2020-11-23 NOTE — Progress Notes (Signed)
Pt ambulated without difficulty or bleeding.   Discharged home with her husband who will drive and stay with pt x 24 hrs.  AVS reviewed with pt and husband with interpreter. Pt and husband denied any questions after reviewing AVS.

## 2020-11-23 NOTE — Interval H&P Note (Signed)
History and Physical Interval Note:  11/23/2020 8:59 AM  Kimberly Bullock  has presented today for surgery, with the diagnosis of CAD w/ stable angina.  The various methods of treatment have been discussed with the patient and family. After consideration of risks, benefits and other options for treatment, the patient has consented to  Procedure(s): LEFT HEART CATH AND CORS/GRAFTS ANGIOGRAPHY (N/A) as a surgical intervention.  The patient's history has been reviewed, patient examined, no change in status, stable for surgery.  I have reviewed the patient's chart and labs.  Questions were answered to the patient's satisfaction.     Theron Arista [facility] 11/23/2020 Cath Lab Visit (complete for each Cath Lab visit)  Clinical Evaluation Leading to the Procedure:   ACS: No.  Non-ACS:    Anginal Classification: CCS II  Anti-ischemic medical therapy: Maximal Therapy (2 or more classes of medications)  Non-Invasive Test Results: No non-invasive testing performed  Prior CABG: Previous CABG

## 2020-11-26 MED FILL — Verapamil HCl IV Soln 2.5 MG/ML: INTRAVENOUS | Qty: 2 | Status: AC

## 2020-12-18 ENCOUNTER — Ambulatory Visit (INDEPENDENT_AMBULATORY_CARE_PROVIDER_SITE_OTHER): Payer: Medicare HMO | Admitting: Cardiology

## 2020-12-18 ENCOUNTER — Other Ambulatory Visit: Payer: Self-pay

## 2020-12-18 ENCOUNTER — Encounter: Payer: Self-pay | Admitting: Cardiology

## 2020-12-18 VITALS — BP 90/50 | HR 91 | Ht 61.0 in | Wt 187.0 lb

## 2020-12-18 DIAGNOSIS — I959 Hypotension, unspecified: Secondary | ICD-10-CM | POA: Diagnosis not present

## 2020-12-18 DIAGNOSIS — E669 Obesity, unspecified: Secondary | ICD-10-CM

## 2020-12-18 DIAGNOSIS — E119 Type 2 diabetes mellitus without complications: Secondary | ICD-10-CM

## 2020-12-18 DIAGNOSIS — I2510 Atherosclerotic heart disease of native coronary artery without angina pectoris: Secondary | ICD-10-CM

## 2020-12-18 DIAGNOSIS — I10 Essential (primary) hypertension: Secondary | ICD-10-CM | POA: Diagnosis not present

## 2020-12-18 DIAGNOSIS — Z79899 Other long term (current) drug therapy: Secondary | ICD-10-CM

## 2020-12-18 LAB — BASIC METABOLIC PANEL
BUN/Creatinine Ratio: 25 (ref 12–28)
BUN: 25 mg/dL (ref 8–27)
CO2: 22 mmol/L (ref 20–29)
Calcium: 9.8 mg/dL (ref 8.7–10.3)
Chloride: 101 mmol/L (ref 96–106)
Creatinine, Ser: 1 mg/dL (ref 0.57–1.00)
Glucose: 130 mg/dL — ABNORMAL HIGH (ref 70–99)
Potassium: 5 mmol/L (ref 3.5–5.2)
Sodium: 138 mmol/L (ref 134–144)
eGFR: 59 mL/min/{1.73_m2} — ABNORMAL LOW (ref >59)

## 2020-12-18 LAB — TSH+T4F+T3FREE
Free T4: 2.72 ng/dL — ABNORMAL HIGH (ref 0.82–1.77)
T3, Free: 3.5 pg/mL (ref 2.0–4.4)
TSH: 0.022 u[IU]/mL — ABNORMAL LOW (ref 0.450–4.500)

## 2020-12-18 LAB — CBC WITH DIFFERENTIAL/PLATELET
Basophils Absolute: 0.1 10*3/uL (ref 0.0–0.2)
Basos: 0 %
EOS (ABSOLUTE): 0.2 10*3/uL (ref 0.0–0.4)
Eos: 1 %
Hematocrit: 34.9 % (ref 34.0–46.6)
Hemoglobin: 12 g/dL (ref 11.1–15.9)
Immature Grans (Abs): 0 10*3/uL (ref 0.0–0.1)
Immature Granulocytes: 0 %
Lymphocytes Absolute: 2.4 10*3/uL (ref 0.7–3.1)
Lymphs: 16 %
MCH: 30.5 pg (ref 26.6–33.0)
MCHC: 34.4 g/dL (ref 31.5–35.7)
MCV: 89 fL (ref 79–97)
Monocytes Absolute: 1.2 10*3/uL — ABNORMAL HIGH (ref 0.1–0.9)
Monocytes: 8 %
Neutrophils Absolute: 11.3 10*3/uL — ABNORMAL HIGH (ref 1.4–7.0)
Neutrophils: 75 %
Platelets: 352 10*3/uL (ref 150–450)
RBC: 3.93 x10E6/uL (ref 3.77–5.28)
RDW: 12.1 % (ref 11.7–15.4)
WBC: 15.2 10*3/uL — ABNORMAL HIGH (ref 3.4–10.8)

## 2020-12-18 LAB — MAGNESIUM: Magnesium: 1.9 mg/dL (ref 1.6–2.3)

## 2020-12-18 NOTE — Progress Notes (Signed)
Cardiology Office Note:    Date:  12/20/2020   ID:  Kimberly Bullock, Kimberly Bullock [birth year], MRN [id number]  PCP:  Hart Carwin, MD (Inactive)  Cardiologist:  Thomasene Ripple, DO  Electrophysiologist:  None   Referring MD: No ref. provider found   " I am doing well"  History of Present Illness:    Kimberly Bullock is a 75 y.o. female with a hx of coronary artery disease status post coronary artery bypass grafting many years ago in [place] type 2 diabetes, hypertension, obesity, hyperlipidemia presents today for follow-up visit.  I saw the patient on Jul 03, 2020 at that visit she had complained that she was experiencing shortness of breath and chest discomfort.  During that time I really wanted for the patient to undergo left heart catheterization but she had a flight scheduled the next day to [place] Since her visit she had not been able to start the Ranexa that was given to her.   I saw the patient on September 27, 2020 she was still experiencing chest discomfort and shortness of breath therefore we did schedule her for heart catheterization.  Initially the patient and her husband agreed and she was scheduled for a heart cath.  The next day we cannot an impromptu visit from the patient husband reporting that she was having significant anxiety and wanted to cancel the heart catheterization because her granddaughter was visiting from [place]  I saw the patient on 11/14/2020 at that time she was still experiencing chest discomfort. We scheduled her for a LHC. She was able to get her LHC with no indication for PCI.  The patient speaks [place] therefore this visit was facilitated by an interpreter on the phone.  Past Medical History:  Diagnosis Date   Diabetes mellitus without complication (HCC)    Hypertension     Past Surgical History:  Procedure Laterality Date   ANAL FISTULOTOMY  1968   CORONARY ARTERY BYPASS GRAFT  2017   Done in [place]   FIXATION KYPHOPLASTY LUMBAR SPINE  2015    LEFT HEART CATH AND CORS/GRAFTS ANGIOGRAPHY N/A 11/23/2020   Procedure: LEFT HEART CATH AND CORS/GRAFTS ANGIOGRAPHY;  Surgeon: Swaziland, Peter M, MD;  Location: [facility]AS[facility];  Service: Cardiovascular;  Laterality: N/A;    Current Medications: Current Meds  Medication Sig   acetaminophen (TYLENOL) 325 MG tablet Take 650 mg by mouth every 6 (six) hours as needed for moderate pain.   Ascorbic Acid (VITAMIN C) 1000 MG tablet Take 1,000 mg by mouth daily.   aspirin EC 81 MG tablet Take 81 mg by mouth daily. Swallow whole.   levothyroxine (SYNTHROID) 100 MCG tablet Take 100 mcg by mouth every other day.   levothyroxine (SYNTHROID) 75 MCG tablet Take 75 mcg by mouth every other day.   magnesium oxide (MAG-OX) 400 MG tablet Take 400 mg by mouth daily.   metFORMIN (GLUCOPHAGE) 850 MG tablet Take 1 tablet (850 mg total) by mouth 2 (two) times daily with a meal.   Pyridoxine HCl (B-6 PO) Take 1 tablet by mouth daily.   ranolazine (RANEXA) 500 MG 12 hr tablet Take 1 tablet (500 mg total) by mouth 2 (two) times daily. (Patient taking differently: Take 500 mg by mouth daily.)   rosuvastatin (CRESTOR) 10 MG tablet Take 10 mg by mouth daily.   telmisartan-hydrochlorothiazide (MICARDIS HCT) 80-12.5 MG tablet Take 1 tablet by mouth daily.   Zinc Gluconate 15 MG TABS Take 15 mg by mouth daily.   [DISCONTINUED] amLODipine (NORVASC)  10 MG tablet Take 10 mg by mouth daily.     Allergies:   Patient has no known allergies.   Social History   Socioeconomic History   Marital status: Married    Spouse name: Not on file   Number of children: Not on file   Years of education: Not on file   Highest education level: Not on file  Occupational History   Not on file  Tobacco Use   Smoking status: Every Day    Types: Cigarettes   Smokeless tobacco: Never  Substance and Sexual Activity   Alcohol use: No    Alcohol/week: 0.0 standard drinks   Drug use: No   Sexual activity: Not on file  Other Topics  Concern   Not on file  Social History Narrative   Not on file   Social Determinants of Health   Financial Resource Strain: Not on file  Food Insecurity: Not on file  Transportation Needs: Not on file  Physical Activity: Not on file  Stress: Not on file  Social Connections: Not on file     Family History: The patient's family history includes Diabetes in an other family member; Hypertension in an other family member. There is no history of Cancer.  ROS:   Review of Systems  Constitution: Negative for decreased appetite, fever and weight gain.  HENT: Negative for congestion, ear discharge, hoarse voice and sore throat.   Eyes: Negative for discharge, redness, vision loss in right eye and visual halos.  Cardiovascular: Negative for chest pain, dyspnea on exertion, leg swelling, orthopnea and palpitations.  Respiratory: Negative for cough, hemoptysis, shortness of breath and snoring.   Endocrine: Negative for heat intolerance and polyphagia.  Hematologic/Lymphatic: Negative for bleeding problem. Does not bruise/bleed easily.  Skin: Negative for flushing, nail changes, rash and suspicious lesions.  Musculoskeletal: Negative for arthritis, joint pain, muscle cramps, myalgias, neck pain and stiffness.  Gastrointestinal: Negative for abdominal pain, bowel incontinence, diarrhea and excessive appetite.  Genitourinary: Negative for decreased libido, genital sores and incomplete emptying.  Neurological: Negative for brief paralysis, focal weakness, headaches and loss of balance.  Psychiatric/Behavioral: Negative for altered mental status, depression and suicidal ideas.  Allergic/Immunologic: Negative for HIV exposure and persistent infections.    EKGs/Labs/Other Studies Reviewed:    The following studies were reviewed today:   EKG:  None today  LHC 11/23/2020   Prox LAD lesion is 100% stenosed.   Ost LM lesion is 30% stenosed.   Prox Cx to Mid Cx lesion is 40% stenosed.   Prox RCA  to Mid RCA lesion is 25% stenosed.   LIMA graft was visualized by angiography and is normal in caliber.   The graft exhibits no disease.   The left ventricular systolic function is normal.   LV end diastolic pressure is normal.   The left ventricular ejection fraction is 55-65% by visual estimate.   Single vessel occlusive CAD with 100% proximal LAD Patent LIMA to the LAD Normal LV function Normal LVEDP  Recent Labs: 12/18/2020: BUN 25; Creatinine, Ser 1.00; Hemoglobin 12.0; Magnesium 1.9; Platelets 352; Potassium 5.0; Sodium 138; TSH 0.022  Recent Lipid Panel No results found for: CHOL, TRIG, HDL, CHOLHDL, VLDL, LDLCALC, LDLDIRECT  Physical Exam:    VS:  BP (!) 90/50 (BP Location: Left Arm)   Pulse 91   Ht 5\' 1"  (1.549 m)   Wt 187 lb (84.8 kg)   SpO2 96%   BMI 35.33 kg/m     Wt Readings from Last  3 Encounters:  12/18/20 187 lb (84.8 kg)  11/23/20 180 lb 12.4 oz (82 kg)  11/14/20 186 lb 9.6 oz (84.6 kg)     GEN: Well nourished, well developed in no acute distress HEENT: Normal NECK: No JVD; No carotid bruits LYMPHATICS: No lymphadenopathy CARDIAC: S1S2 noted,RRR, no murmurs, rubs, gallops RESPIRATORY:  Clear to auscultation without rales, wheezing or rhonchi  ABDOMEN: Soft, non-tender, non-distended, +bowel sounds, no guarding. EXTREMITIES: No edema, No cyanosis, no clubbing MUSCULOSKELETAL:  No deformity  SKIN: Warm and dry NEUROLOGIC:  Alert and oriented x 3, non-focal PSYCHIATRIC:  Normal affect, good insight  ASSESSMENT:    1. Low systolic blood pressure   2. Medication management   3. Coronary artery disease involving native coronary artery of native heart, unspecified whether angina present   4. Primary hypertension   5. Obesity (BMI 30-39.9)   6. Diabetes mellitus without complication (HCC)    PLAN:     Her systolic blood pressure is low today. She had some intermittent low blood pressure on home reading. I will stop amlodipine for now.  She will  continue to take her blood pressure daily and send me updated bp information. Will get blood work today. Thankfully she is not experiencing any anginal symptoms.  DM - managed by her pcp. Obesity - diet and exercise advised.  The patient is in agreement with the above plan. The patient left the office in stable condition.     Medication Adjustments/Labs and Tests Ordered: Current medicines are reviewed at length with the patient today.  Concerns regarding medicines are outlined above.  Orders Placed This Encounter  Procedures   Basic metabolic panel   Magnesium   CBC with Differential/Platelet   TSH+T4F+T3Free   No orders of the defined types were placed in this encounter.   Patient Instructions  Medication Instructions:  Your physician has recommended you make the following change in your medication:  STOP: Amlodipine  *If you need a refill on your cardiac medications before your next appointment, please call your pharmacy*   Lab Work: Your physician recommends that you return for lab work in:  TODAY: BMET, Mag, CBC, Tsh+T3+T4 If you have labs (blood work) drawn today and your tests are completely normal, you will receive your results only by: MyChart Message (if you have MyChart) OR A paper copy in the mail If you have any lab test that is abnormal or we need to change your treatment, we will call you to review the results.   Testing/Procedures: None   Follow-Up: At [facility], you and your health needs are our priority.  As part of our continuing mission to provide you with exceptional heart care, we have created designated Provider Care Teams.  These Care Teams include your primary Cardiologist (physician) and Advanced Practice Providers (APPs -  Physician Assistants and Nurse Practitioners) who all work together to provide you with the care you need, when you need it.  We recommend signing up for the patient portal called "MyChart".  Sign up information is  provided on this After Visit Summary.  MyChart is used to connect with patients for Virtual Visits (Telemedicine).  Patients are able to view lab/test results, encounter notes, upcoming appointments, etc.  Non-urgent messages can be sent to your provider as well.   To learn more about what you can do with MyChart, go to [email or web address].    Your next appointment:   1 year(s)  The format for your next appointment:   In Person  Provider:   Thomasene Ripple, DO {    Other Instructions     Adopting a Healthy Lifestyle.  Know what a healthy weight is for you (roughly BMI <25) and aim to maintain this   Aim for 7+ servings of fruits and vegetables daily   65-80+ fluid ounces of water or unsweet tea for healthy kidneys   Limit to max 1 drink of alcohol per day; avoid smoking/tobacco   Limit animal fats in diet for cholesterol and heart health - choose grass fed whenever available   Avoid highly processed foods, and foods high in saturated/trans fats   Aim for low stress - take time to unwind and care for your mental health   Aim for 150 min of moderate intensity exercise weekly for heart health, and weights twice weekly for bone health   Aim for 7-9 hours of sleep daily   When it comes to diets, agreement about the perfect plan isnt easy to find, even among the experts. Experts at the [facility] [employer] developed an idea known as the Healthy Eating Plate. Just imagine a plate divided into logical, healthy portions.   The emphasis is on diet quality:   Load up on vegetables and fruits - one-half of your plate: Aim for color and variety, and remember that potatoes dont count.   Go for whole grains - one-quarter of your plate: Whole wheat, barley, wheat berries, quinoa, oats, brown rice, and foods made with them. If you want pasta, go with whole wheat pasta.   Protein power - one-quarter of your plate: Fish, chicken, beans, and nuts are all healthy, versatile  protein sources. Limit red meat.   The diet, however, does go beyond the plate, offering a few other suggestions.   Use healthy plant oils, such as olive, canola, soy, corn, sunflower and peanut. Check the labels, and avoid partially hydrogenated oil, which have unhealthy trans fats.   If youre thirsty, drink water. Coffee and tea are good in moderation, but skip sugary drinks and limit milk and dairy products to one or two daily servings.   The type of carbohydrate in the diet is more important than the amount. Some sources of carbohydrates, such as vegetables, fruits, whole grains, and beans-are healthier than others.   Finally, stay active  Signed, Thomasene Ripple, DO  12/20/2020 10:15 PM    [facility]

## 2020-12-18 NOTE — Patient Instructions (Signed)
Medication Instructions:  Your physician has recommended you make the following change in your medication:  STOP: Amlodipine  *If you need a refill on your cardiac medications before your next appointment, please call your pharmacy*   Lab Work: Your physician recommends that you return for lab work in:  TODAY: BMET, Mag, CBC, Tsh+T3+T4 If you have labs (blood work) drawn today and your tests are completely normal, you will receive your results only by: MyChart Message (if you have MyChart) OR A paper copy in the mail If you have any lab test that is abnormal or we need to change your treatment, we will call you to review the results.   Testing/Procedures: None   Follow-Up: At [facility], you and your health needs are our priority.  As part of our continuing mission to provide you with exceptional heart care, we have created designated Provider Care Teams.  These Care Teams include your primary Cardiologist (physician) and Advanced Practice Providers (APPs -  Physician Assistants and Nurse Practitioners) who all work together to provide you with the care you need, when you need it.  We recommend signing up for the patient portal called "MyChart".  Sign up information is provided on this After Visit Summary.  MyChart is used to connect with patients for Virtual Visits (Telemedicine).  Patients are able to view lab/test results, encounter notes, upcoming appointments, etc.  Non-urgent messages can be sent to your provider as well.   To learn more about what you can do with MyChart, go to [email or web address].    Your next appointment:   1 year(s)  The format for your next appointment:   In Person  Provider:   Thomasene Ripple, DO {    Other Instructions

## 2020-12-20 DIAGNOSIS — I959 Hypotension, unspecified: Secondary | ICD-10-CM | POA: Insufficient documentation

## 2020-12-20 DIAGNOSIS — Z79899 Other long term (current) drug therapy: Secondary | ICD-10-CM | POA: Insufficient documentation

## 2021-02-20 DIAGNOSIS — L0390 Cellulitis, unspecified: Secondary | ICD-10-CM | POA: Diagnosis not present

## 2021-02-20 DIAGNOSIS — R1032 Left lower quadrant pain: Secondary | ICD-10-CM | POA: Diagnosis not present

## 2021-02-25 DIAGNOSIS — L0390 Cellulitis, unspecified: Secondary | ICD-10-CM | POA: Diagnosis not present

## 2021-02-25 DIAGNOSIS — R1032 Left lower quadrant pain: Secondary | ICD-10-CM | POA: Diagnosis not present

## 2021-03-04 DIAGNOSIS — L0390 Cellulitis, unspecified: Secondary | ICD-10-CM | POA: Diagnosis not present

## 2021-03-04 DIAGNOSIS — R1032 Left lower quadrant pain: Secondary | ICD-10-CM | POA: Diagnosis not present

## 2021-03-04 DIAGNOSIS — K5900 Constipation, unspecified: Secondary | ICD-10-CM | POA: Diagnosis not present

## 2021-05-02 DIAGNOSIS — E119 Type 2 diabetes mellitus without complications: Secondary | ICD-10-CM | POA: Diagnosis not present

## 2021-05-02 DIAGNOSIS — H353131 Nonexudative age-related macular degeneration, bilateral, early dry stage: Secondary | ICD-10-CM | POA: Diagnosis not present

## 2021-05-02 DIAGNOSIS — H40013 Open angle with borderline findings, low risk, bilateral: Secondary | ICD-10-CM | POA: Diagnosis not present

## 2021-05-02 DIAGNOSIS — H25813 Combined forms of age-related cataract, bilateral: Secondary | ICD-10-CM | POA: Diagnosis not present

## 2021-05-02 DIAGNOSIS — H524 Presbyopia: Secondary | ICD-10-CM | POA: Diagnosis not present

## 2021-05-03 DIAGNOSIS — E1159 Type 2 diabetes mellitus with other circulatory complications: Secondary | ICD-10-CM | POA: Diagnosis not present

## 2021-05-03 DIAGNOSIS — E039 Hypothyroidism, unspecified: Secondary | ICD-10-CM | POA: Diagnosis not present

## 2021-05-03 DIAGNOSIS — I2510 Atherosclerotic heart disease of native coronary artery without angina pectoris: Secondary | ICD-10-CM | POA: Diagnosis not present

## 2021-05-03 DIAGNOSIS — I129 Hypertensive chronic kidney disease with stage 1 through stage 4 chronic kidney disease, or unspecified chronic kidney disease: Secondary | ICD-10-CM | POA: Diagnosis not present

## 2021-05-03 DIAGNOSIS — E7800 Pure hypercholesterolemia, unspecified: Secondary | ICD-10-CM | POA: Diagnosis not present

## 2021-05-03 DIAGNOSIS — N390 Urinary tract infection, site not specified: Secondary | ICD-10-CM | POA: Diagnosis not present

## 2021-05-09 ENCOUNTER — Ambulatory Visit: Payer: Self-pay

## 2021-05-09 ENCOUNTER — Encounter: Payer: Self-pay | Admitting: Orthopaedic Surgery

## 2021-05-09 ENCOUNTER — Ambulatory Visit (INDEPENDENT_AMBULATORY_CARE_PROVIDER_SITE_OTHER): Payer: Medicare Other | Admitting: Orthopaedic Surgery

## 2021-05-09 DIAGNOSIS — M545 Low back pain, unspecified: Secondary | ICD-10-CM | POA: Diagnosis not present

## 2021-05-09 DIAGNOSIS — G8929 Other chronic pain: Secondary | ICD-10-CM | POA: Diagnosis not present

## 2021-05-09 NOTE — Progress Notes (Signed)
? ?Office Visit Note ?  ?Patient: Kimberly Bullock           ?Date of Birth: October 06, 1945           ?MRN: [id number] ?Visit Date: 05/09/2021 ?             ?Requested by: No referring provider defined for this encounter. ?PCP: Hart Carwin, MD (Inactive) ? ? ?Assessment & Plan: ?Visit Diagnoses: No diagnosis found. ? ?Plan: Pleasant 76 year old [place] woman who presents with her husband who interprets.  She has had a history of 3 to 4 months of recurring back pain.  It is isolated to her lower back and just radiates slightly into her posterior hips.  She denies any loss of bowel or bladder control she denies any weakness she denies any paresthesias or other radicular symptoms.  She is treated this with aspirin and Tylenol.  She has had a previous kyphoplasty done in [place] 7 years ago.  Because of her kyphoplasty and she also has a history of osteoporosis by bone density studies.  Given her symptoms and her previous surgery procedure we recommend an MRI with follow-up afterwards ? ?Follow-Up Instructions: No follow-ups on file.  ? ?Orders:  ?No orders of the defined types were placed in this encounter. ? ?No orders of the defined types were placed in this encounter. ? ? ? ? Procedures: ?No procedures performed ? ? ?Clinical Data: ?No additional findings. ? ? ?Subjective: ?Chief Complaint  ?Patient presents with  ? Lower Back - Pain  ?Patient presents today for lower back pain. Her husband is translating for her today. She fell from a chair 7years ago and fractured L3. She had "cement" put in. She is having lower back pain. No pain down her legs. She takes Aspirin and Tylenol. ? ?HPI ? ?Review of Systems ? ? ?Objective: ?Vital Signs: There were no vitals taken for this visit. ? ?Physical Exam ?Constitutional:   ?   Appearance: Normal appearance.  ?Pulmonary:  ?   Effort: Pulmonary effort is normal.  ?Neurological:  ?   General: No focal deficit present.  ?   Mental Status: She is alert and oriented to person,  place, and time.  ?Psychiatric:     ?   Mood and Affect: Mood normal.  ? ? ?Ortho Exam ?Of the lumbar spine she has no specific tenderness to palpation over the cervical thoracic spine she does have some tenderness to palpation but no redness or step-offs at the lower lumbar spine.  Pain radiates slightly into the fair paravertebral muscles.  She has lower extremity strength 5 out of 5 with dorsiflexion plantarflexion resisted flexion and extension.  She is able to wiggle her toes and arises easily out of the chair ?Specialty Comments:  ?No specialty comments available. ? ?Imaging: ?No results found. ? ? ?PMFS History: ?Patient Active Problem List  ? Diagnosis Date Noted  ? Low systolic blood pressure 12/20/2020  ? Medication management 12/20/2020  ? CAD (coronary artery disease) of bypass graft 11/23/2020  ? 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 Coronary artery disease involving native coronary artery of native heart 07/03/2020  ? Mixed hyperlipidemia 07/03/2020  ? Obesity (BMI 30-39.9) 07/03/2020  ? Shortness of breath 07/03/2020  ? Hypertension   ? Diabetes mellitus without complication (HCC)   ? Acute diverticulitis 06/30/2015  ? ?Past Medical History:  ?Diagnosis Date  ? Diabetes mellitus without complication (HCC)   ? Hypertension   ?  ?Family History  ?Problem Relation Age of  Onset  ? Cancer Neg Hx   ? Hypertension Other   ? Diabetes Other   ?  ?Past Surgical History:  ?Procedure Laterality Date  ? ANAL FISTULOTOMY  1968  ? CORONARY ARTERY BYPASS GRAFT  2017  ? Done in [place]  ? FIXATION KYPHOPLASTY LUMBAR SPINE  2015  ? LEFT HEART CATH AND CORS/GRAFTS ANGIOGRAPHY N/A 11/23/2020  ? Procedure: LEFT HEART CATH AND CORS/GRAFTS ANGIOGRAPHY;  Surgeon: Swazil[place], Peter M, MD;  Location: [facility] INVASIVE CV LAB;  Service: Cardiovascular;  Laterality: N/A;  ? ?Social History  ? ?Occupational History  ? Not on file  ?Tobacco Use  ? Smoking status: Every Day  ?  Types: Cigarettes  ? Smokeless tobacco: Never  ?Substance and  Sexual Activity  ? Alcohol use: No  ?  Alcohol/week: 0.0 standard drinks  ? Drug use: No  ? Sexual activity: Not on file  ? ? ? ? ? ? ?

## 2021-05-10 DIAGNOSIS — E1159 Type 2 diabetes mellitus with other circulatory complications: Secondary | ICD-10-CM | POA: Diagnosis not present

## 2021-05-10 DIAGNOSIS — E785 Hyperlipidemia, unspecified: Secondary | ICD-10-CM | POA: Diagnosis not present

## 2021-05-10 DIAGNOSIS — Z951 Presence of aortocoronary bypass graft: Secondary | ICD-10-CM | POA: Diagnosis not present

## 2021-06-01 ENCOUNTER — Ambulatory Visit
Admission: RE | Admit: 2021-06-01 | Discharge: 2021-06-01 | Disposition: A | Discharge status: Home / Self Care | Payer: Medicare Other | Source: Ambulatory Visit | Attending: Orthopaedic Surgery | Admitting: Orthopaedic Surgery

## 2021-06-01 DIAGNOSIS — M545 Low back pain, unspecified: Secondary | ICD-10-CM

## 2021-06-01 DIAGNOSIS — M48061 Spinal stenosis, lumbar region without neurogenic claudication: Secondary | ICD-10-CM | POA: Diagnosis not present

## 2021-06-03 ENCOUNTER — Telehealth: Payer: Self-pay | Admitting: Orthopaedic Surgery

## 2021-06-03 NOTE — Telephone Encounter (Signed)
Called patient's husband. Scheduled appointment for tomorrow afternoon with Dr.Whitfield.  ?

## 2021-06-03 NOTE — Telephone Encounter (Signed)
Please call the pt regarding if [place] can give the results. Pt is going to [place] at the end of May and wants something done on May 2 when they come in the office. ? ?I advised that depending on the MRI !  ?there could be several different things that is offered. We dont know until the MRI is read by the provider. ? ?I advised the pt that me or Lauren would call him back. He wants to talk to Lauren for 5 mins to discuss this issue. I advised that was hard as Leotis Shames is in Clinic daily, and there is no specific time that she is free. ?

## 2021-06-04 ENCOUNTER — Encounter: Payer: Self-pay | Admitting: Orthopaedic Surgery

## 2021-06-04 ENCOUNTER — Ambulatory Visit (INDEPENDENT_AMBULATORY_CARE_PROVIDER_SITE_OTHER): Payer: Medicare Other | Admitting: Orthopaedic Surgery

## 2021-06-04 DIAGNOSIS — M5441 Lumbago with sciatica, right side: Secondary | ICD-10-CM | POA: Diagnosis not present

## 2021-06-04 DIAGNOSIS — M5442 Lumbago with sciatica, left side: Secondary | ICD-10-CM | POA: Diagnosis not present

## 2021-06-04 DIAGNOSIS — G8929 Other chronic pain: Secondary | ICD-10-CM

## 2021-06-04 NOTE — Progress Notes (Signed)
? ?Office Visit Note ?  ?Patient: Kimberly Bullock           ?Date of Birth: 1945/08/04           ?MRN: [id number] ?Visit Date: 06/04/2021 ?             ?Requested by: No referring provider defined for this encounter. ?PCP: Hart Carwin, MD (Inactive) ? ? ?Assessment & Plan: ?Visit Diagnoses:  ?1. Chronic bilateral low back pain with bilateral sciatica   ? ? ?Plan: MRI scan of the lumbar spine demonstrated a prior superior endplate compression fracture status post cement augmentation.  A small amount of cement is located within the canal posterior to the L3 vertebral body which contributes to multifactorial severe canal stenosis associated with moderate bilateral foraminal stenosis at L2-3.  No significant canal stenosis at the remaining lumbar levels.  No evidence of a new compression fracture.  [name] is experiencing low back pain and some lower extremity discomfort when she stands or walks for any length of time.  She initially was having some discomfort with her right total knee replacement but that seems to have resolved.  Long discussion regarding the findings on the MRI scan and treatment options.  I think an epidural steroid injection would be her best bet.  Discussed this in detail as well.  We will schedule and like to see her back within a month ? ?Follow-Up Instructions: Return in about 1 month (around 07/04/2021).  ? ?Orders:  ?No orders of the defined types were placed in this encounter. ? ?No orders of the defined types were placed in this encounter. ? ? ? ? Procedures: ?No procedures performed ? ? ?Clinical Data: ?No additional findings. ? ? ?Subjective: ?Chief Complaint  ?Patient presents with  ? Lower Back - Follow-up  ?  MRI review  ?Patient presents today for follow up on her lower back. She had an MRI and is here today for those results.  No change in symptoms ? ?HPI ? ?Review of Systems ? ? ?Objective: ?Vital Signs: There were no vitals taken for this visit. ? ?Physical  Exam ?Constitutional:   ?   Appearance: She is well-developed.  ?Pulmonary:  ?   Effort: Pulmonary effort is normal.  ?Skin: ?   General: Skin is warm and dry.  ?Neurological:  ?   Mental Status: She is alert and oriented to person, place, and time.  ?Psychiatric:     ?   Behavior: Behavior normal.  ? ? ?Ortho Exam awake alert and oriented x3.  Communication is difficult because of language differences.  Her husband interpreted for [place].  Straight leg raise was negative.  Left total knee replacement was not hot or red.  She had full quick extension of flexed over 100 degrees without instability.  No hip pain.  No percussible tenderness lumbar spine today ? ?Specialty Comments:  ?No specialty comments available. ? ?Imaging: ?No results found. ? ? ?PMFS History: ?Patient Active Problem List  ? Diagnosis Date Noted  ? Low back pain 05/09/2021  ? Low systolic blood pressure 12/20/2020  ? Medication management 12/20/2020  ? CAD (coronary artery disease) of bypass graft 11/23/2020  ? 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 Coronary artery disease involving native coronary artery of native heart 07/03/2020  ? Mixed hyperlipidemia 07/03/2020  ? Obesity (BMI 30-39.9) 07/03/2020  ? Shortness of breath 07/03/2020  ? Hypertension   ? Diabetes mellitus without complication (HCC)   ? Acute diverticulitis 06/30/2015  ? ?Past Medical  History:  ?Diagnosis Date  ? Diabetes mellitus without complication (HCC)   ? Hypertension   ?  ?Family History  ?Problem Relation Age of Onset  ? Cancer Neg Hx   ? Hypertension Other   ? Diabetes Other   ?  ?Past Surgical History:  ?Procedure Laterality Date  ? ANAL FISTULOTOMY  1968  ? CORONARY ARTERY BYPASS GRAFT  2017  ? Done in [place]  ? FIXATION KYPHOPLASTY LUMBAR SPINE  2015  ? LEFT HEART CATH AND CORS/GRAFTS ANGIOGRAPHY N/A 11/23/2020  ? Procedure: LEFT HEART CATH AND CORS/GRAFTS ANGIOGRAPHY;  Surgeon: [place]il[place], Jolette Lana M, MD;  Location: [facility] INVASIVE CV LAB;  Service: Cardiovascular;  Laterality: N/A;   ? ?Social History  ? ?Occupational History  ? Not on file  ?Tobacco Use  ? Smoking status: Every Day  ?  Types: Cigarettes  ? Smokeless tobacco: Never  ?Substance and Sexual Activity  ? Alcohol use: No  ?  Alcohol/week: 0.0 standard drinks  ? Drug use: No  ? Sexual activity: Not on file  ? ? ? ? ? ? ?

## 2021-06-11 ENCOUNTER — Ambulatory Visit: Payer: Medicare Other | Admitting: Orthopaedic Surgery

## 2021-06-17 ENCOUNTER — Ambulatory Visit
Admission: RE | Admit: 2021-06-17 | Discharge: 2021-06-17 | Disposition: A | Discharge status: Home / Self Care | Payer: Medicare Other | Source: Ambulatory Visit | Attending: Orthopaedic Surgery | Admitting: Orthopaedic Surgery

## 2021-06-17 DIAGNOSIS — G8929 Other chronic pain: Secondary | ICD-10-CM

## 2021-06-17 DIAGNOSIS — M48061 Spinal stenosis, lumbar region without neurogenic claudication: Secondary | ICD-10-CM | POA: Diagnosis not present

## 2021-06-17 MED ORDER — METHYLPREDNISOLONE ACETATE 40 MG/ML INJ SUSP (RADIOLOG
80.0000 mg | Freq: Once | INTRAMUSCULAR | Status: AC
  Administered 2021-06-17: 80 mg via EPIDURAL

## 2021-06-17 MED ORDER — IOPAMIDOL (ISOVUE-M 200) INJECTION 41%
1.0000 mL | Freq: Once | INTRAMUSCULAR | Status: AC
  Administered 2021-06-17: 1 mL via EPIDURAL

## 2021-06-17 NOTE — Discharge Instructions (Signed)

## 2021-11-05 DIAGNOSIS — H40013 Open angle with borderline findings, low risk, bilateral: Secondary | ICD-10-CM | POA: Diagnosis not present

## 2021-11-20 DIAGNOSIS — H401121 Primary open-angle glaucoma, left eye, mild stage: Secondary | ICD-10-CM | POA: Diagnosis not present

## 2021-11-22 DIAGNOSIS — I129 Hypertensive chronic kidney disease with stage 1 through stage 4 chronic kidney disease, or unspecified chronic kidney disease: Secondary | ICD-10-CM | POA: Diagnosis not present

## 2021-11-22 DIAGNOSIS — E039 Hypothyroidism, unspecified: Secondary | ICD-10-CM | POA: Diagnosis not present

## 2021-11-22 DIAGNOSIS — E785 Hyperlipidemia, unspecified: Secondary | ICD-10-CM | POA: Diagnosis not present

## 2021-11-22 DIAGNOSIS — E1122 Type 2 diabetes mellitus with diabetic chronic kidney disease: Secondary | ICD-10-CM | POA: Diagnosis not present

## 2021-11-22 DIAGNOSIS — N1831 Chronic kidney disease, stage 3a: Secondary | ICD-10-CM | POA: Diagnosis not present

## 2021-11-22 DIAGNOSIS — E1159 Type 2 diabetes mellitus with other circulatory complications: Secondary | ICD-10-CM | POA: Diagnosis not present

## 2021-11-29 DIAGNOSIS — M25511 Pain in right shoulder: Secondary | ICD-10-CM | POA: Diagnosis not present

## 2021-11-29 DIAGNOSIS — Z951 Presence of aortocoronary bypass graft: Secondary | ICD-10-CM | POA: Diagnosis not present

## 2021-11-29 DIAGNOSIS — Z0000 Encounter for general adult medical examination without abnormal findings: Secondary | ICD-10-CM | POA: Diagnosis not present

## 2021-11-29 DIAGNOSIS — E7800 Pure hypercholesterolemia, unspecified: Secondary | ICD-10-CM | POA: Diagnosis not present

## 2021-11-29 DIAGNOSIS — M25522 Pain in left elbow: Secondary | ICD-10-CM | POA: Diagnosis not present

## 2022-03-06 DIAGNOSIS — I2510 Atherosclerotic heart disease of native coronary artery without angina pectoris: Secondary | ICD-10-CM | POA: Diagnosis not present

## 2022-03-06 DIAGNOSIS — N281 Cyst of kidney, acquired: Secondary | ICD-10-CM | POA: Diagnosis not present

## 2022-03-06 DIAGNOSIS — R1011 Right upper quadrant pain: Secondary | ICD-10-CM | POA: Diagnosis not present

## 2022-03-06 DIAGNOSIS — I10 Essential (primary) hypertension: Secondary | ICD-10-CM | POA: Diagnosis not present

## 2022-03-06 DIAGNOSIS — R079 Chest pain, unspecified: Secondary | ICD-10-CM | POA: Diagnosis not present

## 2022-03-06 DIAGNOSIS — R1013 Epigastric pain: Secondary | ICD-10-CM | POA: Diagnosis not present

## 2022-03-06 DIAGNOSIS — K449 Diaphragmatic hernia without obstruction or gangrene: Secondary | ICD-10-CM | POA: Diagnosis not present

## 2022-03-06 DIAGNOSIS — K838 Other specified diseases of biliary tract: Secondary | ICD-10-CM | POA: Diagnosis not present

## 2022-03-06 DIAGNOSIS — K922 Gastrointestinal hemorrhage, unspecified: Secondary | ICD-10-CM | POA: Diagnosis not present

## 2022-03-06 DIAGNOSIS — Z951 Presence of aortocoronary bypass graft: Secondary | ICD-10-CM | POA: Diagnosis not present

## 2022-03-06 DIAGNOSIS — E119 Type 2 diabetes mellitus without complications: Secondary | ICD-10-CM | POA: Diagnosis not present

## 2022-03-06 DIAGNOSIS — R1110 Vomiting, unspecified: Secondary | ICD-10-CM | POA: Diagnosis not present

## 2022-03-06 DIAGNOSIS — E1129 Type 2 diabetes mellitus with other diabetic kidney complication: Secondary | ICD-10-CM | POA: Diagnosis not present

## 2022-03-06 DIAGNOSIS — K828 Other specified diseases of gallbladder: Secondary | ICD-10-CM | POA: Diagnosis not present

## 2022-03-06 DIAGNOSIS — K810 Acute cholecystitis: Secondary | ICD-10-CM | POA: Diagnosis not present

## 2022-03-06 DIAGNOSIS — E871 Hypo-osmolality and hyponatremia: Secondary | ICD-10-CM | POA: Diagnosis not present

## 2022-03-06 DIAGNOSIS — K5909 Other constipation: Secondary | ICD-10-CM | POA: Diagnosis not present

## 2022-03-06 DIAGNOSIS — E039 Hypothyroidism, unspecified: Secondary | ICD-10-CM | POA: Diagnosis not present

## 2022-03-06 DIAGNOSIS — R933 Abnormal findings on diagnostic imaging of other parts of digestive tract: Secondary | ICD-10-CM | POA: Diagnosis not present

## 2022-03-06 DIAGNOSIS — E785 Hyperlipidemia, unspecified: Secondary | ICD-10-CM | POA: Diagnosis not present

## 2022-03-06 DIAGNOSIS — R100 Acute abdomen: Secondary | ICD-10-CM | POA: Diagnosis not present

## 2022-03-06 DIAGNOSIS — K269 Duodenal ulcer, unspecified as acute or chronic, without hemorrhage or perforation: Secondary | ICD-10-CM | POA: Diagnosis not present

## 2022-03-06 DIAGNOSIS — K5730 Diverticulosis of large intestine without perforation or abscess without bleeding: Secondary | ICD-10-CM | POA: Diagnosis not present

## 2022-03-06 DIAGNOSIS — K264 Chronic or unspecified duodenal ulcer with hemorrhage: Secondary | ICD-10-CM | POA: Diagnosis not present

## 2022-03-06 DIAGNOSIS — R109 Unspecified abdominal pain: Secondary | ICD-10-CM | POA: Diagnosis not present

## 2022-03-06 DIAGNOSIS — K2100 Gastro-esophageal reflux disease with esophagitis, without bleeding: Secondary | ICD-10-CM | POA: Diagnosis not present

## 2022-03-06 DIAGNOSIS — D72829 Elevated white blood cell count, unspecified: Secondary | ICD-10-CM | POA: Diagnosis not present

## 2022-03-07 DIAGNOSIS — I2510 Atherosclerotic heart disease of native coronary artery without angina pectoris: Secondary | ICD-10-CM | POA: Diagnosis not present

## 2022-03-07 DIAGNOSIS — I10 Essential (primary) hypertension: Secondary | ICD-10-CM | POA: Diagnosis not present

## 2022-03-07 DIAGNOSIS — R1013 Epigastric pain: Secondary | ICD-10-CM | POA: Diagnosis not present

## 2022-03-18 DIAGNOSIS — I959 Hypotension, unspecified: Secondary | ICD-10-CM | POA: Diagnosis not present

## 2022-03-18 DIAGNOSIS — Z789 Other specified health status: Secondary | ICD-10-CM | POA: Diagnosis not present

## 2022-03-18 DIAGNOSIS — Z951 Presence of aortocoronary bypass graft: Secondary | ICD-10-CM | POA: Diagnosis not present

## 2022-03-18 DIAGNOSIS — K269 Duodenal ulcer, unspecified as acute or chronic, without hemorrhage or perforation: Secondary | ICD-10-CM | POA: Diagnosis not present

## 2022-03-18 DIAGNOSIS — K922 Gastrointestinal hemorrhage, unspecified: Secondary | ICD-10-CM | POA: Diagnosis not present

## 2022-03-18 DIAGNOSIS — K264 Chronic or unspecified duodenal ulcer with hemorrhage: Secondary | ICD-10-CM | POA: Diagnosis not present

## 2022-03-18 DIAGNOSIS — Z09 Encounter for follow-up examination after completed treatment for conditions other than malignant neoplasm: Secondary | ICD-10-CM | POA: Diagnosis not present

## 2022-04-09 DIAGNOSIS — I129 Hypertensive chronic kidney disease with stage 1 through stage 4 chronic kidney disease, or unspecified chronic kidney disease: Secondary | ICD-10-CM | POA: Diagnosis not present

## 2022-04-09 DIAGNOSIS — N1831 Chronic kidney disease, stage 3a: Secondary | ICD-10-CM | POA: Diagnosis not present

## 2022-04-09 DIAGNOSIS — K269 Duodenal ulcer, unspecified as acute or chronic, without hemorrhage or perforation: Secondary | ICD-10-CM | POA: Diagnosis not present

## 2022-05-28 DIAGNOSIS — E7800 Pure hypercholesterolemia, unspecified: Secondary | ICD-10-CM | POA: Diagnosis not present

## 2022-05-28 DIAGNOSIS — E039 Hypothyroidism, unspecified: Secondary | ICD-10-CM | POA: Diagnosis not present

## 2022-05-28 DIAGNOSIS — I129 Hypertensive chronic kidney disease with stage 1 through stage 4 chronic kidney disease, or unspecified chronic kidney disease: Secondary | ICD-10-CM | POA: Diagnosis not present

## 2022-05-28 DIAGNOSIS — E1159 Type 2 diabetes mellitus with other circulatory complications: Secondary | ICD-10-CM | POA: Diagnosis not present

## 2022-05-28 DIAGNOSIS — Z951 Presence of aortocoronary bypass graft: Secondary | ICD-10-CM | POA: Diagnosis not present

## 2022-06-04 DIAGNOSIS — E7800 Pure hypercholesterolemia, unspecified: Secondary | ICD-10-CM | POA: Diagnosis not present

## 2022-06-04 DIAGNOSIS — E039 Hypothyroidism, unspecified: Secondary | ICD-10-CM | POA: Diagnosis not present

## 2022-06-04 DIAGNOSIS — E1159 Type 2 diabetes mellitus with other circulatory complications: Secondary | ICD-10-CM | POA: Diagnosis not present

## 2022-06-04 DIAGNOSIS — I129 Hypertensive chronic kidney disease with stage 1 through stage 4 chronic kidney disease, or unspecified chronic kidney disease: Secondary | ICD-10-CM | POA: Diagnosis not present

## 2022-06-04 DIAGNOSIS — Z951 Presence of aortocoronary bypass graft: Secondary | ICD-10-CM | POA: Diagnosis not present

## 2022-06-18 DIAGNOSIS — H401121 Primary open-angle glaucoma, left eye, mild stage: Secondary | ICD-10-CM | POA: Diagnosis not present

## 2022-09-22 DIAGNOSIS — H401121 Primary open-angle glaucoma, left eye, mild stage: Secondary | ICD-10-CM | POA: Diagnosis not present

## 2022-09-22 DIAGNOSIS — H40011 Open angle with borderline findings, low risk, right eye: Secondary | ICD-10-CM | POA: Diagnosis not present

## 2022-09-22 DIAGNOSIS — H25813 Combined forms of age-related cataract, bilateral: Secondary | ICD-10-CM | POA: Diagnosis not present

## 2022-09-22 DIAGNOSIS — H353131 Nonexudative age-related macular degeneration, bilateral, early dry stage: Secondary | ICD-10-CM | POA: Diagnosis not present

## 2022-09-22 DIAGNOSIS — H524 Presbyopia: Secondary | ICD-10-CM | POA: Diagnosis not present

## 2022-10-01 DIAGNOSIS — H524 Presbyopia: Secondary | ICD-10-CM | POA: Diagnosis not present

## 2022-11-27 DIAGNOSIS — E7800 Pure hypercholesterolemia, unspecified: Secondary | ICD-10-CM | POA: Diagnosis not present

## 2022-11-27 DIAGNOSIS — I129 Hypertensive chronic kidney disease with stage 1 through stage 4 chronic kidney disease, or unspecified chronic kidney disease: Secondary | ICD-10-CM | POA: Diagnosis not present

## 2022-11-27 DIAGNOSIS — E1159 Type 2 diabetes mellitus with other circulatory complications: Secondary | ICD-10-CM | POA: Diagnosis not present

## 2022-11-27 DIAGNOSIS — E039 Hypothyroidism, unspecified: Secondary | ICD-10-CM | POA: Diagnosis not present

## 2022-11-27 DIAGNOSIS — Z951 Presence of aortocoronary bypass graft: Secondary | ICD-10-CM | POA: Diagnosis not present

## 2022-12-04 DIAGNOSIS — E1159 Type 2 diabetes mellitus with other circulatory complications: Secondary | ICD-10-CM | POA: Diagnosis not present

## 2022-12-04 DIAGNOSIS — H6120 Impacted cerumen, unspecified ear: Secondary | ICD-10-CM | POA: Diagnosis not present

## 2022-12-04 DIAGNOSIS — Z0000 Encounter for general adult medical examination without abnormal findings: Secondary | ICD-10-CM | POA: Diagnosis not present

## 2022-12-04 DIAGNOSIS — E039 Hypothyroidism, unspecified: Secondary | ICD-10-CM | POA: Diagnosis not present

## 2022-12-04 DIAGNOSIS — I2510 Atherosclerotic heart disease of native coronary artery without angina pectoris: Secondary | ICD-10-CM | POA: Diagnosis not present

## 2022-12-04 DIAGNOSIS — I129 Hypertensive chronic kidney disease with stage 1 through stage 4 chronic kidney disease, or unspecified chronic kidney disease: Secondary | ICD-10-CM | POA: Diagnosis not present

## 2022-12-04 DIAGNOSIS — E7800 Pure hypercholesterolemia, unspecified: Secondary | ICD-10-CM | POA: Diagnosis not present

## 2022-12-08 DIAGNOSIS — Z1231 Encounter for screening mammogram for malignant neoplasm of breast: Secondary | ICD-10-CM | POA: Diagnosis not present

## 2022-12-17 DIAGNOSIS — R922 Inconclusive mammogram: Secondary | ICD-10-CM | POA: Diagnosis not present

## 2023-01-22 ENCOUNTER — Ambulatory Visit: Payer: Medicare HMO | Admitting: Orthopaedic Surgery

## 2023-01-22 ENCOUNTER — Other Ambulatory Visit (INDEPENDENT_AMBULATORY_CARE_PROVIDER_SITE_OTHER): Payer: Medicare HMO

## 2023-01-22 ENCOUNTER — Encounter: Payer: Self-pay | Admitting: Orthopaedic Surgery

## 2023-01-22 DIAGNOSIS — M5441 Lumbago with sciatica, right side: Secondary | ICD-10-CM

## 2023-01-22 DIAGNOSIS — M25551 Pain in right hip: Secondary | ICD-10-CM

## 2023-01-22 DIAGNOSIS — M5442 Lumbago with sciatica, left side: Secondary | ICD-10-CM | POA: Diagnosis not present

## 2023-01-22 DIAGNOSIS — G8929 Other chronic pain: Secondary | ICD-10-CM

## 2023-01-22 NOTE — Progress Notes (Signed)
Office Visit Note   Patient: Kimberly Bullock           Date of Birth: November 16, 1945           MRN: [id number] Visit Date: 01/22/2023              Requested by: No referring provider defined for this encounter. PCP: Hart Carwin, MD (Inactive)   Assessment & Plan: Visit Diagnoses:  1. Chronic bilateral low back pain with bilateral sciatica   2. Pain in right hip     Plan: Impression is right hip..  I feel her symptoms and physical exam are more consistent with right hip arthritis although she may have a component of lumbar radiculopathy.  I would like to make a referral to Dr. Shon Baton for ultrasound-guided cortisone injection to the right hip joint.  She understands and agrees.  Follow-up as needed.  Follow-Up Instructions: Return for f/u with dr. Shon Baton for hip injection.   Orders:  Orders Placed This Encounter  Procedures   XR HIP UNILAT W OR W/O PELVIS 2-3 VIEWS RIGHT   XR Lumbar Spine 2-3 Views   AMB referral to sports medicine   No orders of the defined types were placed in this encounter.     Procedures: No procedures performed   Clinical Data: No additional findings.   Subjective: Chief Complaint  Patient presents with   Right Hip - Pain   Lower Back - Pain    HPI patient is a very pleasant 77 year old here today with right low back and hip pain.  The pain starts in her right low back radiates into the right buttock, lateral hip, groin, anterior and lateral thigh and stops at the knee.  Pain is constant but worse when she is going from a seated to standing position as well as with the first few steps of walking.  She does get some relief after she has been walking for a long time.  She has been taking Tylenol without significant relief.  She does note paresthesias to the right thigh.  She tells me that she fell off a chair while cleaning windows 15+ years ago breaking L3 and subsequently undergoing a kyphoplasty.  Review of Systems as detailed in HPI.  All  others reviewed and are negative.   Objective: Vital Signs: There were no vitals taken for this visit.  Physical Exam well-developed well-nourished female in no acute distress.  Alert and oriented x 3.  Ortho Exam right hip exam: Pain with logroll and FADIR testing.  No pain with Stinchfield.  No pain with straight leg raise.  She does have increased pain with lumbar extension.  No pain with flexion.  No focal weakness.  She is neurovascularly intact distally.  Specialty Comments:  No specialty comments available.  Imaging: No results found.    PMFS History: Patient Active Problem List   Diagnosis Date Noted   Low back pain 05/09/2021   Low systolic blood pressure 12/20/2020   Medication management 12/20/2020   CAD (coronary artery disease) of bypass graft 11/23/2020   Angina pectoris (HCC) 07/03/2020   Coronary artery disease involving native coronary artery of native heart 07/03/2020   Mixed hyperlipidemia 07/03/2020   Obesity (BMI 30-39.9) 07/03/2020   Shortness of breath 07/03/2020   Hypertension    Diabetes mellitus without complication (HCC)    Acute diverticulitis 06/30/2015   Past Medical History:  Diagnosis Date   Diabetes mellitus without complication (HCC)    Hypertension  Family History  Problem Relation Age of Onset   Cancer Neg Hx    Hypertension Other    Diabetes Other     Past Surgical History:  Procedure Laterality Date   ANAL FISTULOTOMY  1968   CORONARY ARTERY BYPASS GRAFT  2017   Done in [place]   FIXATION KYPHOPLASTY LUMBAR SPINE  2015   LEFT HEART CATH AND CORS/GRAFTS ANGIOGRAPHY N/A 11/23/2020   Procedure: LEFT HEART CATH AND CORS/GRAFTS ANGIOGRAPHY;  Surgeon: Swaziland, Peter M, MD;  Location: [facility];  Service: Cardiovascular;  Laterality: N/A;   Social History   Occupational History   Not on file  Tobacco Use   Smoking status: Every Day    Types: Cigarettes   Smokeless tobacco: Never  Substance and Sexual Activity    Alcohol use: No    Alcohol/week: 0.0 standard drinks of alcohol   Drug use: No   Sexual activity: Not on file

## 2023-01-27 ENCOUNTER — Encounter: Payer: Self-pay | Admitting: Sports Medicine

## 2023-01-27 ENCOUNTER — Other Ambulatory Visit: Payer: Self-pay

## 2023-01-27 ENCOUNTER — Ambulatory Visit (INDEPENDENT_AMBULATORY_CARE_PROVIDER_SITE_OTHER): Payer: Medicare HMO | Admitting: Sports Medicine

## 2023-01-27 DIAGNOSIS — M25551 Pain in right hip: Secondary | ICD-10-CM | POA: Diagnosis not present

## 2023-01-27 DIAGNOSIS — M1611 Unilateral primary osteoarthritis, right hip: Secondary | ICD-10-CM

## 2023-01-27 MED ORDER — METHYLPREDNISOLONE ACETATE 40 MG/ML IJ SUSP
40.0000 mg | INTRAMUSCULAR | Status: AC | PRN
  Administered 2023-01-27: 40 mg via INTRA_ARTICULAR

## 2023-01-27 MED ORDER — LIDOCAINE HCL 1 % IJ SOLN
4.0000 mL | INTRAMUSCULAR | Status: AC | PRN
  Administered 2023-01-27: 4 mL

## 2023-01-27 NOTE — Progress Notes (Signed)
   Procedure Note  Patient: Kimberly Bullock             Date of Birth: 10-16-1945           MRN: [id number]             Visit Date: 01/27/2023  Procedures: Visit Diagnoses:  1. Pain in right hip   2. Unilateral primary osteoarthritis, right hip    Large Joint Inj: R hip joint on 01/27/2023 9:52 AM Indications: pain Details: 22 G 3.5 in needle, ultrasound-guided anterior approach Medications: 4 mL lidocaine 1 %; 40 mg methylPREDNISolone acetate 40 MG/ML Outcome: tolerated well, no immediate complications  Procedure: US-guided intra-articular hip injection, Right After discussion on risks/benefits/indications and informed verbal consent was obtained, a timeout was performed. Patient was lying supine on exam table. The hip was cleaned with betadine and alcohol swabs. Then utilizing ultrasound guidance, the patient's femoral head and neck junction was identified and subsequently injected with 4:1 lidocaine:depomedrol via an in-plane approach with ultrasound visualization of the injectate administered into the hip joint. Patient tolerated procedure well without immediate complications.  Procedure, treatment alternatives, risks and benefits explained, specific risks discussed. Consent was given by the patient. Immediately prior to procedure a time out was called to verify the correct patient, procedure, equipment, support staff and site/side marked as required. Patient was prepped and draped in the usual sterile fashion.    - I evaluated the patient about 10 minutes post-injection and she had significant improvement in pain and range of motion - follow-up with Dr. Roda Shutters as indicated; I am happy to see them as needed  Madelyn Brunner, DO Primary Care Sports Medicine Physician  [facility] - Orthopedics  This note was dictated using Dragon naturally speaking software and may contain errors in syntax, spelling, or content which have not been identified prior to signing this note.

## 2023-03-03 ENCOUNTER — Encounter: Payer: Self-pay | Admitting: Family Medicine

## 2023-03-10 ENCOUNTER — Telehealth: Payer: Self-pay | Admitting: Gastroenterology

## 2023-03-10 NOTE — Telephone Encounter (Signed)
Good Morning Dr. Barron Alvine    Patients husband called stating that she had received a letter in the mail from a referral that Dr. Daneen Schick made for patient to have a colonoscopy with you.   Looking at patients chart she just had a EGD in January of 2024 in  [place], patient is now wishing to have a colonoscopy. Records are in epic, will you please review and advise on scheduling patient?   Thank you.

## 2023-03-13 NOTE — Telephone Encounter (Signed)
Good Morning Dr. Barron Alvine      Patients husband called again stating that she had received a letter in the mail from a referral that Dr. Daneen Schick made for patient to have a colonoscopy with you.    Looking at patients chart she just had a EGD in January of 2024 in  [place], patient is now wishing to have a colonoscopy. Records are in epic, will you please review and advise on scheduling patient?     Thank you.

## 2023-04-15 ENCOUNTER — Encounter: Payer: Self-pay | Admitting: Gastroenterology

## 2023-05-07 DIAGNOSIS — H401121 Primary open-angle glaucoma, left eye, mild stage: Secondary | ICD-10-CM | POA: Diagnosis not present

## 2023-05-07 DIAGNOSIS — H40011 Open angle with borderline findings, low risk, right eye: Secondary | ICD-10-CM | POA: Diagnosis not present

## 2023-05-14 DIAGNOSIS — Z91128 Patient's intentional underdosing of medication regimen for other reason: Secondary | ICD-10-CM | POA: Diagnosis not present

## 2023-05-14 DIAGNOSIS — I129 Hypertensive chronic kidney disease with stage 1 through stage 4 chronic kidney disease, or unspecified chronic kidney disease: Secondary | ICD-10-CM | POA: Diagnosis not present

## 2023-06-02 DIAGNOSIS — E1159 Type 2 diabetes mellitus with other circulatory complications: Secondary | ICD-10-CM | POA: Diagnosis not present

## 2023-06-02 DIAGNOSIS — E039 Hypothyroidism, unspecified: Secondary | ICD-10-CM | POA: Diagnosis not present

## 2023-06-02 DIAGNOSIS — I129 Hypertensive chronic kidney disease with stage 1 through stage 4 chronic kidney disease, or unspecified chronic kidney disease: Secondary | ICD-10-CM | POA: Diagnosis not present

## 2023-06-02 DIAGNOSIS — E7800 Pure hypercholesterolemia, unspecified: Secondary | ICD-10-CM | POA: Diagnosis not present

## 2023-06-03 ENCOUNTER — Encounter: Payer: Self-pay | Admitting: Gastroenterology

## 2023-06-03 ENCOUNTER — Telehealth: Payer: Self-pay | Admitting: Gastroenterology

## 2023-06-03 ENCOUNTER — Ambulatory Visit: Admitting: Gastroenterology

## 2023-06-03 VITALS — BP 114/66 | HR 80 | Ht 59.0 in | Wt 189.6 lb

## 2023-06-03 DIAGNOSIS — K838 Other specified diseases of biliary tract: Secondary | ICD-10-CM

## 2023-06-03 DIAGNOSIS — R140 Abdominal distension (gaseous): Secondary | ICD-10-CM

## 2023-06-03 DIAGNOSIS — K5904 Chronic idiopathic constipation: Secondary | ICD-10-CM

## 2023-06-03 DIAGNOSIS — Z8711 Personal history of peptic ulcer disease: Secondary | ICD-10-CM

## 2023-06-03 DIAGNOSIS — R1011 Right upper quadrant pain: Secondary | ICD-10-CM | POA: Diagnosis not present

## 2023-06-03 LAB — LAB REPORT - SCANNED
A1c: 8.5
EGFR: 32
Free T4: 0.46 ng/dL
TSH: 46.1 — AB (ref 0.41–5.90)

## 2023-06-03 NOTE — Patient Instructions (Signed)
 Continue high fiber diet with flaxseed Can continue tea with Senna po daily.  No NSAID's Advised to go to the ER if there is any severe weakness, severe abdominal pain, vomit blood, dark red blood in your bowel movement, shortness of breath or chest pain.     You have been scheduled for an MRI at [facility] on 06/05/23. Your appointment time is 3:00pm. Please arrive to admitting (at main entrance of the hospital) 30 minutes prior to your appointment time for registration purposes. Please make certain not to have anything to eat or drink 4 hours prior to your test. In addition, if you have any metal in your body, have a pacemaker or defibrillator, please be sure to let your ordering physician know. This test typically takes 45 minutes to 1 hour to complete. Should you need to reschedule, please call [phone] to do so.  _______________________________________________________  If your blood pressure at your visit was 140/90 or greater, please contact your primary care physician to follow up on this.  _______________________________________________________  If you are age 83 or older, your body mass index should be between 23-30. Your Body mass index is 38.29 kg/m. If this is out of the aforementioned range listed, please consider follow up with your Primary Care Provider.  If you are age 13 or younger, your body mass index should be between 19-25. Your Body mass index is 38.29 kg/m. If this is out of the aformentioned range listed, please consider follow up with your Primary Care Provider.   ________________________________________________________  The [facility] providers would like to encourage you to use MYCHART to communicate with providers for non-urgent requests or questions.  Due to long hold times on the telephone, sending your provider a message by [facility] may be a faster and more efficient way to get a response.  Please allow 48 business hours for a response.  Please  remember that this is for non-urgent requests.  _______________________________________________________ Thank you for trusting me with your gastrointestinal care!   Dyanna Glasgow, NP

## 2023-06-03 NOTE — Progress Notes (Signed)
 Chief Complaint: establish care, abdominal pain, constipation Primary GI Doctor:Dr. Cirigiliano  HPI:  Patient is a  78  year old female patient with past medical history of type 2 diabetes, CAD, hypothyroidism, high cholesterol, and gastric ulcer, who presents to establish care with new gastroenterologist and discuss constipation and abdominal pain.  Due to language barrier, an interpreter was present during the history-taking and subsequent discussion (and for part of the physical exam) with this patient.   Interval History     Patient presents accompanied by husband to establish care with gastroenterologist.  Patient states she has issues with chronic constipation.she will release her bowels every 3-4 days which causes pain in the abdomen. She has tried OTC laxatives which help initially, but then stop working. She has tried recently OTC Tea with Senna the past week which she states has helped. She is also using flaxseed in the morning. She states she will sometimes have large amount of stool come out and other times small balls. She states some of the stool floats on the water in the toilet.  She states the bloating has been better over the course of the last week.  Patient tells me she had a history of gastric ulcers where the stool was dark, however she has not had that issue for quite some time.  She was prescribed PPI therapy at that time but states she is no longer taking.    Patient also complains of right upper quadrant pain intermittently for quite some time No nausea or vomiting. Pain is intermittent. Can occur with eating.  Appetite good. she has had workup in the past to back in [place]  for potential gallbladder issues.  In the records it states they recommended MRCP for dilated CBD.  Patient states they never completed.  Patient does not take any NSAIDs.  Per patient's husband she had her last colonoscopy 2 years ago in [place]  but unaware of results.  She also  had endoscopy in January 2024 in [place]  for epigastric pain.    The patient and her husband would like to have her symptoms resolved prior to going to [place] on [address] for 3 mths.     Wt Readings from Last 3 Encounters:  06/03/23 189 lb 9.6 oz (86 kg)  12/18/20 187 lb (84.8 kg)  11/23/20 180 lb 12.4 oz (82 kg)    Past Medical History:  Diagnosis Date   Anal fissure    Diabetes mellitus without complication (HCC)    Elevated cholesterol    Hypertension    Thyroid  disease     Past Surgical History:  Procedure Laterality Date   ANAL FISTULOTOMY  1968   CORONARY ARTERY BYPASS GRAFT  2017   Done in [place]   FIXATION KYPHOPLASTY LUMBAR SPINE  2015   LEFT HEART CATH AND CORS/GRAFTS ANGIOGRAPHY N/A 11/23/2020   Procedure: LEFT HEART CATH AND CORS/GRAFTS ANGIOGRAPHY;  Surgeon: Swaziland, Peter M, MD;  Location: [facility];  Service: Cardiovascular;  Laterality: N/A;    Current Outpatient Medications  Medication Sig Dispense Refill   Flaxseed, Linseed, (FLAX SEEDS PO) Take by mouth.     levothyroxine (SYNTHROID) 75 MCG tablet Take 75 mcg by mouth every other day.     magnesium oxide (MAG-OX) 400 MG tablet Take 400 mg by mouth daily.     metFORMIN  (GLUCOPHAGE ) 850 MG tablet Take 1 tablet (850 mg total) by mouth 2 (two) times daily with a meal.     pantoprazole (PROTONIX)  40 MG tablet Take 40 mg by mouth 2 (two) times daily.     ranolazine  (RANEXA ) 500 MG 12 hr tablet Take 1 tablet (500 mg total) by mouth 2 (two) times daily. (Patient taking differently: Take 500 mg by mouth daily.) 180 tablet 3   rosuvastatin (CRESTOR) 10 MG tablet Take 10 mg by mouth daily.     telmisartan-hydrochlorothiazide (MICARDIS HCT) 80-12.5 MG tablet Take 1 tablet by mouth daily.     UNABLE TO FIND Med Name: Weight Control Tea     Zinc Gluconate 15 MG TABS Take 15 mg by mouth daily.     levothyroxine (SYNTHROID) 100 MCG tablet Take 100 mcg by mouth every other day. (Patient not taking:  Reported on 06/03/2023)     No current facility-administered medications for this visit.    Allergies as of 06/03/2023   (No Known Allergies)    Family History  Problem Relation Age of Onset   Hypertension Other    Diabetes Other    Cancer Neg Hx    Colon cancer Neg Hx    Esophageal cancer Neg Hx    Stomach cancer Neg Hx     Review of Systems:    Constitutional: No weight loss, fever, chills, weakness or fatigue HEENT: Eyes: No change in vision               Ears, Nose, Throat:  No change in hearing or congestion Skin: No rash or itching Cardiovascular: No chest pain, chest pressure or palpitations   Respiratory: No SOB or cough Gastrointestinal: See HPI and otherwise negative Genitourinary: No dysuria or change in urinary frequency Neurological: No headache, dizziness or syncope Musculoskeletal: No new muscle or joint pain Hematologic: No bleeding or bruising Psychiatric: No history of depression or anxiety    Physical Exam:  Vital signs: BP 114/66   Pulse 80   Ht 4\' 11"  (1.499 m)   Wt 189 lb 9.6 oz (86 kg)   SpO2 95%   BMI 38.29 kg/m   Constitutional:   Pleasant  female appears to be in NAD, Well developed, Well nourished, alert and cooperative Throat: Oral cavity and pharynx without inflammation, swelling or lesion.  Respiratory: Respirations even and unlabored. Lungs clear to auscultation bilaterally.   No wheezes, crackles, or rhonchi.  Cardiovascular: Normal S1, S2. Regular rate and rhythm. No peripheral edema, cyanosis or pallor.  Gastrointestinal:  Soft, nondistended, RUQ abdominal tenderness with palpation. No rebound or guarding. Normal bowel sounds. No appreciable masses or hepatomegaly. Rectal:  Not performed.  Msk:  Symmetrical without gross deformities. Without edema, no deformity or joint abnormality.  Neurologic:  Alert and  oriented x4;  grossly normal neurologically.  Skin:   Dry and intact without significant lesions or rashes. Psychiatric:  Oriented to person, place and time. Demonstrates good judgement and reason without abnormal affect or behaviors.  RELEVANT LABS AND IMAGING: CBC    Latest Ref Rng & Units 12/18/2020   10:47 AM 11/19/2020    9:14 AM 09/27/2020   11:14 AM  CBC  WBC 3.4 - 10.8 x10E3/uL 15.2  11.7  11.5   Hemoglobin 11.1 - 15.9 g/dL 78.2  95.6  21.3   Hematocrit 34.0 - 46.6 % 34.9  36.7  40.7   Platelets 150 - 450 x10E3/uL 352  311  301      CMP     Latest Ref Rng & Units 12/18/2020   10:47 AM 11/23/2020    7:54 AM 11/19/2020    9:14 AM  CMP  Glucose 70 - 99 mg/dL 147  829  562   BUN 8 - 27 mg/dL 25  24  31    Creatinine 0.57 - 1.00 mg/dL 1.30  8.65  7.84   Sodium 134 - 144 mmol/L 138  138  140   Potassium 3.5 - 5.2 mmol/L 5.0  4.1  5.3   Chloride 96 - 106 mmol/L 101  105  102   CO2 20 - 29 mmol/L 22  23  20    Calcium 8.7 - 10.3 mg/dL 9.8  9.4  69.6      Lab Results  Component Value Date   TSH 0.022 (L) 12/18/2020  11/27/2022 labs show: WBC 10.7, hemoglobin 12.6, platelets 314, A1c 7.4, BUN 23, creatinine 0.96, normal LFTs, TSH 5.110 09/17/2020 echo-Left ventricular ejection fraction, by estimation, is 50 to 55%.  06/30/15 CT ABD/pelvis MPRESSION: Sigmoid diverticulitis. No drainable fluid collection/ abscess. No free air to suggest macroscopic perforation. Dilated common duct, measuring 19 mm, although smoothly tapering at the ampulla. By report, this measured 12 mm in 2001, indicating a chronic process. In the setting of normal LFTs, this is of questionable clinical significance.  No evidence of bowel obstruction.  Normal appendix. Additional ancillary findings as above 03/06/22 CT abd/pelvis w IV contrast 1.  There is fusiform dilatation of the extrahepatic bile duct, measuring up to 18 mm in diameter with lesser central intrahepatic biliary ductal dilatation. No obstructing lesion is evident. Recommend correlation with liver function tests to exclude obstructive physiology. If there is concern  for choledocholithiasis, MRCP would be recommended.  2.  Small hiatal hernia. Above the hernia, the distal esophageal wall appears mildly thickened, suggesting esophagitis.  3.  Colonic diverticulosis without evidence of diverticulitis.  03/06/22 US  abd limited IMPRESSION:  Enlarged gallbladder with intra and extrahepatic biliary ductal dilatation. Positive sonographic Murphy sign. Please correlate for possible cholecystitis.  03/07/2022, IR angiogram mesenteric FINDINGS: No active bleed identified. There is a normal variant of accessory right hepatic artery coming from the GDA which had to be excluded by the coil pack in order to adequately empirically treat for duodenal ulcer bleed. Celiac artery was patent on injection.  03/07/22 NM hepatobiliary 1. Normal gallbladder filling. No evidence of cystic duct obstruction to suggest acute cholecystitis.  2. There is no extension of radiotracer into the duodenum over the course of 60 minutes. (Examination was terminated at this point, as the patient was transferred to the GE lab.) Biliary obstruction cannot be excluded based upon findings of this exam.  03/07/2022, EGD LA Grade C (one or more mucosal breaks continuous between tops of 2 or  more mucosal folds, less than 75% circumference) esophagitis with no  bleeding was found in the lower third of the esophagus. Clotted blood was found in the gastric body. One spurting cratered duodenal ulcer with spurting hemorrhage (Forrest Class Ia) was found in the duodenal bulb. The lesion was 10 mm in largest dimension. Area was successfully injected with 5 mL of a 0.1 mg/mL solution of epinephrine for hemostasis. To stop active bleeding, one hemostatic clip was successfully placed (MR conditional). Clip manufacturer: [employer]. To stop active bleeding, hemostatic spray was deployed. Several sprays were applied. There was no bleeding at the end of the procedure.   Assessment: Encounter Diagnoses  Name  Primary?   RUQ abdominal pain Yes   Chronic idiopathic constipation    History of gastric ulcer    Bloating    Dilated bile duct  78 year old female patient that presents to establish care with new gastroenterologist.  Patient has history of chronic constipation that has responded well to high-fiber diet and senna tea p.o. daily.  We discussed continuing current regimen if it is working however if it stops working we can consider prescription medication such as pro secretory agent Linzess or Trulance.  We will request records from colonoscopy for colon cancer surveillance screening purposes.     Patient also has persistent right upper quadrant pain and records show that she has had multiple imaging to follow-up on finding on 03/06/22 CT abdomen showing CBD dilation of up to 18 mm with no evidence of obstruction 03/07/22. HIDA scan shows normal gallbladder filling, no evidence to suggest acute cholecystitis.  Hospital in [place]  recommended follow-up with MRCP which was not completed. I recommended patient have follow-up lab work completed, husband states she had lab work yesterday with PCP and will bring me the results. I like like to follow-up on the CBD dilatation and RUQ pain with imaging. If MRI is not covered will order another abdominal u/s and proceed from there. Patient is going back to [place] in 3 weeks so we are limited with time. Educated on No NSAIDs with history of gastric ulcers. She is currently not on PPI therapy. Garin Mata consider restarting in future if imaging negative.  Plan: - Continue high fiber diet with flaxseed -Can continue tea with Senna po daily. We discussed other pharmaceutical therapies like pro secretory agent, declines at this time. Can consider in future -husband will bring lab work done with PCP yesterday to office for records. - Educated on No NSAID's -Recommend MRCP outpatient , will order today -Follow up before patient leaves on [address] with me (last appointment  due to longer appt needed)  Thank you for the courtesy of this consult. Please call me with any questions or concerns.   Carlee Tesfaye, FNP-C [facility] 06/03/2023, 12:41 PM  Cc: No ref. provider found

## 2023-06-03 NOTE — Telephone Encounter (Signed)
 Patient husband called and stated that someone had called him and is wanting to speak to the nurse or Deanna May regarding his wife appointment today. Please advise.

## 2023-06-05 ENCOUNTER — Other Ambulatory Visit (HOSPITAL_COMMUNITY): Payer: Self-pay | Admitting: Gastroenterology

## 2023-06-05 ENCOUNTER — Other Ambulatory Visit: Payer: Self-pay | Admitting: Gastroenterology

## 2023-06-05 ENCOUNTER — Ambulatory Visit (HOSPITAL_COMMUNITY)
Admission: RE | Admit: 2023-06-05 | Discharge: 2023-06-05 | Disposition: A | Discharge status: Home / Self Care | Source: Ambulatory Visit | Attending: Gastroenterology | Admitting: Gastroenterology

## 2023-06-05 ENCOUNTER — Encounter (HOSPITAL_COMMUNITY): Payer: Self-pay

## 2023-06-05 DIAGNOSIS — R140 Abdominal distension (gaseous): Secondary | ICD-10-CM

## 2023-06-05 DIAGNOSIS — Z8711 Personal history of peptic ulcer disease: Secondary | ICD-10-CM

## 2023-06-05 DIAGNOSIS — K5904 Chronic idiopathic constipation: Secondary | ICD-10-CM

## 2023-06-05 DIAGNOSIS — R52 Pain, unspecified: Secondary | ICD-10-CM | POA: Diagnosis not present

## 2023-06-05 DIAGNOSIS — K838 Other specified diseases of biliary tract: Secondary | ICD-10-CM | POA: Diagnosis not present

## 2023-06-05 DIAGNOSIS — I700 Atherosclerosis of aorta: Secondary | ICD-10-CM | POA: Diagnosis not present

## 2023-06-05 DIAGNOSIS — R1011 Right upper quadrant pain: Secondary | ICD-10-CM | POA: Diagnosis not present

## 2023-06-05 DIAGNOSIS — I517 Cardiomegaly: Secondary | ICD-10-CM | POA: Diagnosis not present

## 2023-06-05 DIAGNOSIS — R9389 Abnormal findings on diagnostic imaging of other specified body structures: Secondary | ICD-10-CM | POA: Diagnosis not present

## 2023-06-05 NOTE — Progress Notes (Signed)
 Agree with the assessment and plan as outlined by [facility], FNP-C.   Agree that MRI/MRCP would be the ideal study to evaluate the fusiform dilation of CBD on previous imaging, along with labs as outlined.  Aleli Navedo, DO, [facility]

## 2023-06-05 NOTE — Progress Notes (Signed)
 Radiology contacted me stating unable to perform MRI/MRCP due to following:  " Pt did not know when she has some of the surgeries in her ABD. We did some clearing xrays to confirm any presences of metal. It looks like in her ABD she had some sort of surgery. This what Radiologist Dr. Areatha Ku said in the report- Metallic foreign bodies in the right upper quadrant could represent embolization coils or endoscopy clips, but are indeterminate. This will require clinical clearing prior to MRI. Indeterminate tiny catheters or wires x2 projecting over the right paramidline thoracoabdominal junction on abdominal radiographs, not localized and of indeterminate etiology. This report can be found in EPIC"   Patient nor husband was able to provide more information to clarify.

## 2023-06-08 ENCOUNTER — Telehealth: Payer: Self-pay | Admitting: Gastroenterology

## 2023-06-08 NOTE — Telephone Encounter (Signed)
 06/02/2023 labs show: WBC 12.7, hemoglobin 12.9, platelets 326, BUN 29, creatinine 1.63, normal LFTs, total bili 0.4, A1c 8.5, TSH 46.100, T4 free direct 0.46

## 2023-06-09 NOTE — Telephone Encounter (Signed)
 Spoke with ptn office on 06/08/23. Pt was informed to obtain operative notes for wife and bring them to the office. Pt's husband stated that he didn't think he would be able to obtain records because procedure was done in [place] 10-15 years ago.

## 2023-06-12 DIAGNOSIS — E1159 Type 2 diabetes mellitus with other circulatory complications: Secondary | ICD-10-CM | POA: Diagnosis not present

## 2023-06-12 DIAGNOSIS — I2510 Atherosclerotic heart disease of native coronary artery without angina pectoris: Secondary | ICD-10-CM | POA: Diagnosis not present

## 2023-06-12 DIAGNOSIS — E039 Hypothyroidism, unspecified: Secondary | ICD-10-CM | POA: Diagnosis not present

## 2023-06-12 DIAGNOSIS — Z951 Presence of aortocoronary bypass graft: Secondary | ICD-10-CM | POA: Diagnosis not present

## 2023-06-12 DIAGNOSIS — N1832 Chronic kidney disease, stage 3b: Secondary | ICD-10-CM | POA: Diagnosis not present

## 2023-06-12 DIAGNOSIS — I129 Hypertensive chronic kidney disease with stage 1 through stage 4 chronic kidney disease, or unspecified chronic kidney disease: Secondary | ICD-10-CM | POA: Diagnosis not present

## 2023-06-12 DIAGNOSIS — E7800 Pure hypercholesterolemia, unspecified: Secondary | ICD-10-CM | POA: Diagnosis not present

## 2023-06-18 ENCOUNTER — Telehealth: Payer: Self-pay | Admitting: Gastroenterology

## 2023-06-18 ENCOUNTER — Ambulatory Visit (INDEPENDENT_AMBULATORY_CARE_PROVIDER_SITE_OTHER): Admitting: Gastroenterology

## 2023-06-18 NOTE — Progress Notes (Deleted)
 Kimberly Bullock

## 2023-06-18 NOTE — Telephone Encounter (Signed)
 Pt came by to discuss few things  1.) he will research into the metallic foreign bodies so we can do MRI when they return from [place]  2.) he tells me she also had issues with high TSH level which has been corrected. She now is feeling much better.  3.) he wanted to clarify if senna tea ok to take daily- and we discussed ok to take daily. He will follow-up with me when they return.   Answered all of his questions.  Bosco Paparella

## 2023-06-19 NOTE — Progress Notes (Signed)
 Erroneous encounter

## 2023-06-30 ENCOUNTER — Telehealth: Payer: Self-pay | Admitting: Gastroenterology

## 2023-06-30 NOTE — Telephone Encounter (Signed)
 Has been emailed documents of about 200 pages from patient's hospitalization in [facility].  Per the records patient had presented to ED with right-sided abdominal pain and nausea and vomiting. CT abdomen showed CBD dilatation of up to 18 mm with no evidence of obstruction 03/07/2022 EGD showed duodenal ulcer and upper GI bleed, s/p clip 126/24 GDA embolization with coils Transferred to ICU postprocedure 1 unit packed red blood cells transfused 03/08/22 extubated and off pressors downgraded Discussed with GI no ERCP or EUS recommended MRCP was pending HIDA shows normal gallbladder filling no evidence to suggest acute cholecystitis Recommended patient stay on PPI therapy twice daily for 3 months Patient returned to [place]  with recommendations to complete MRCP as outpatient

## 2023-12-07 DIAGNOSIS — I700 Atherosclerosis of aorta: Secondary | ICD-10-CM | POA: Diagnosis not present

## 2023-12-07 DIAGNOSIS — Z96649 Presence of unspecified artificial hip joint: Secondary | ICD-10-CM | POA: Diagnosis not present

## 2023-12-07 DIAGNOSIS — I129 Hypertensive chronic kidney disease with stage 1 through stage 4 chronic kidney disease, or unspecified chronic kidney disease: Secondary | ICD-10-CM | POA: Diagnosis not present

## 2023-12-07 DIAGNOSIS — K219 Gastro-esophageal reflux disease without esophagitis: Secondary | ICD-10-CM | POA: Diagnosis not present

## 2023-12-07 DIAGNOSIS — Z79890 Hormone replacement therapy (postmenopausal): Secondary | ICD-10-CM | POA: Diagnosis not present

## 2023-12-07 DIAGNOSIS — Z6837 Body mass index (BMI) 37.0-37.9, adult: Secondary | ICD-10-CM | POA: Diagnosis not present

## 2023-12-07 DIAGNOSIS — Z9849 Cataract extraction status, unspecified eye: Secondary | ICD-10-CM | POA: Diagnosis not present

## 2023-12-07 DIAGNOSIS — E1122 Type 2 diabetes mellitus with diabetic chronic kidney disease: Secondary | ICD-10-CM | POA: Diagnosis not present

## 2023-12-07 DIAGNOSIS — E039 Hypothyroidism, unspecified: Secondary | ICD-10-CM | POA: Diagnosis not present

## 2023-12-07 DIAGNOSIS — R32 Unspecified urinary incontinence: Secondary | ICD-10-CM | POA: Diagnosis not present

## 2023-12-07 DIAGNOSIS — M1990 Unspecified osteoarthritis, unspecified site: Secondary | ICD-10-CM | POA: Diagnosis not present

## 2023-12-07 DIAGNOSIS — I2510 Atherosclerotic heart disease of native coronary artery without angina pectoris: Secondary | ICD-10-CM | POA: Diagnosis not present

## 2023-12-07 DIAGNOSIS — E785 Hyperlipidemia, unspecified: Secondary | ICD-10-CM | POA: Diagnosis not present

## 2023-12-07 DIAGNOSIS — N189 Chronic kidney disease, unspecified: Secondary | ICD-10-CM | POA: Diagnosis not present

## 2023-12-08 DIAGNOSIS — I129 Hypertensive chronic kidney disease with stage 1 through stage 4 chronic kidney disease, or unspecified chronic kidney disease: Secondary | ICD-10-CM | POA: Diagnosis not present

## 2023-12-08 DIAGNOSIS — E1159 Type 2 diabetes mellitus with other circulatory complications: Secondary | ICD-10-CM | POA: Diagnosis not present

## 2023-12-08 DIAGNOSIS — E7800 Pure hypercholesterolemia, unspecified: Secondary | ICD-10-CM | POA: Diagnosis not present

## 2023-12-08 DIAGNOSIS — I2510 Atherosclerotic heart disease of native coronary artery without angina pectoris: Secondary | ICD-10-CM | POA: Diagnosis not present

## 2023-12-08 DIAGNOSIS — E039 Hypothyroidism, unspecified: Secondary | ICD-10-CM | POA: Diagnosis not present

## 2023-12-14 DIAGNOSIS — Z1231 Encounter for screening mammogram for malignant neoplasm of breast: Secondary | ICD-10-CM | POA: Diagnosis not present

## 2023-12-15 DIAGNOSIS — Z951 Presence of aortocoronary bypass graft: Secondary | ICD-10-CM | POA: Diagnosis not present

## 2023-12-15 DIAGNOSIS — N1831 Chronic kidney disease, stage 3a: Secondary | ICD-10-CM | POA: Diagnosis not present

## 2023-12-15 DIAGNOSIS — E1159 Type 2 diabetes mellitus with other circulatory complications: Secondary | ICD-10-CM | POA: Diagnosis not present

## 2023-12-15 DIAGNOSIS — M25511 Pain in right shoulder: Secondary | ICD-10-CM | POA: Diagnosis not present

## 2023-12-15 DIAGNOSIS — J988 Other specified respiratory disorders: Secondary | ICD-10-CM | POA: Diagnosis not present

## 2023-12-15 DIAGNOSIS — I129 Hypertensive chronic kidney disease with stage 1 through stage 4 chronic kidney disease, or unspecified chronic kidney disease: Secondary | ICD-10-CM | POA: Diagnosis not present

## 2023-12-15 DIAGNOSIS — Z0000 Encounter for general adult medical examination without abnormal findings: Secondary | ICD-10-CM | POA: Diagnosis not present

## 2023-12-15 DIAGNOSIS — B372 Candidiasis of skin and nail: Secondary | ICD-10-CM | POA: Diagnosis not present

## 2023-12-28 ENCOUNTER — Ambulatory Visit: Admitting: Gastroenterology

## 2023-12-28 VITALS — BP 112/74 | HR 80 | Ht 59.0 in | Wt 185.5 lb

## 2023-12-28 DIAGNOSIS — K5904 Chronic idiopathic constipation: Secondary | ICD-10-CM

## 2023-12-28 DIAGNOSIS — Z1211 Encounter for screening for malignant neoplasm of colon: Secondary | ICD-10-CM | POA: Diagnosis not present

## 2023-12-28 DIAGNOSIS — K2100 Gastro-esophageal reflux disease with esophagitis, without bleeding: Secondary | ICD-10-CM | POA: Diagnosis not present

## 2023-12-28 MED ORDER — NA SULFATE-K SULFATE-MG SULF 17.5-3.13-1.6 GM/177ML PO SOLN: 1.0000 | Freq: Once | ORAL | 0 refills | Status: AC

## 2023-12-28 NOTE — Progress Notes (Signed)
 Chief Complaint: follow-up abdominal pain, constipation Primary GI Doctor:Dr. Cirigiliano  HPI:  Patient is a  78  year old female patient with past medical history of type 2 diabetes, CAD, hypothyroidism, high cholesterol, and gastric ulcer, who presented to us  to establish care with new gastroenterologist and discuss constipation and abdominal pain.  Patient last seen by myself on 06/18/23.   Interval History     Patient presents for follow-up, accompanied by husband who helps translate. They just returned from [place] visiitng their daughter for extended period of time.   They enquire about scheduling colonoscopy, there was misunderstanding at last appointment that she did not complete a colonoscopy 2 years ago, only a endoscopy. Her husband confirms she has not had colonoscopy in over 10+ years done in [place]. Reports it was normal. He is unsure if she has had more than one.  Patient has history of chronic constipation and doing well on OTC Tea with Senna along with high fiber diet. No blood in stool. She has BM every day.  She reports the RUQ pain she had at last appointment has also improved. Appetite good. Husband reports she has actually gained weight because her appetite is so good. Activity is limited with back problems. He mentions she has had a lot of recent stress with their son and this causes her increase GI issues. Patient is taking pantoprazole 40 mg po daily for history of gastric ulcers. She has intermittent pyrosis. No black tarry stools.   Patient recently treated for URI with antibiotics. Patient feeling much better.  Wt Readings from Last 3 Encounters:  12/28/23 185 lb 8 oz (84.1 kg)  06/03/23 189 lb 9.6 oz (86 kg)  12/18/20 187 lb (84.8 kg)    Past Medical History:  Diagnosis Date   Anal fissure    Diabetes mellitus without complication (HCC)    Elevated cholesterol    Hypertension    Thyroid  disease     Past Surgical History:  Procedure Laterality Date    ANAL FISTULOTOMY  1968   CORONARY ARTERY BYPASS GRAFT  2017   Done in [place]   FIXATION KYPHOPLASTY LUMBAR SPINE  2015   LEFT HEART CATH AND CORS/GRAFTS ANGIOGRAPHY N/A 11/23/2020   Procedure: LEFT HEART CATH AND CORS/GRAFTS ANGIOGRAPHY;  Surgeon: Jordan, Peter M, MD;  Location: [facility];  Service: Cardiovascular;  Laterality: N/A;    Current Outpatient Medications  Medication Sig Dispense Refill   Na Sulfate-K Sulfate-Mg Sulfate concentrate (SUPREP) 17.5-3.13-1.6 GM/177ML SOLN Take 1 kit (354 mLs total) by mouth once for 1 dose. 354 mL 0   Flaxseed, Linseed, (FLAX SEEDS PO) Take by mouth.     levothyroxine (SYNTHROID) 100 MCG tablet Take 100 mcg by mouth every other day. (Patient not taking: Reported on 06/03/2023)     levothyroxine (SYNTHROID) 75 MCG tablet Take 75 mcg by mouth every other day.     magnesium oxide (MAG-OX) 400 MG tablet Take 400 mg by mouth daily.     metFORMIN  (GLUCOPHAGE ) 850 MG tablet Take 1 tablet (850 mg total) by mouth 2 (two) times daily with a meal.     pantoprazole (PROTONIX) 40 MG tablet Take 40 mg by mouth 2 (two) times daily.     ranolazine  (RANEXA ) 500 MG 12 hr tablet Take 1 tablet (500 mg total) by mouth 2 (two) times daily. (Patient taking differently: Take 500 mg by mouth daily.) 180 tablet 3   rosuvastatin (CRESTOR) 10 MG tablet Take 10 mg by mouth daily.  telmisartan-hydrochlorothiazide (MICARDIS HCT) 80-12.5 MG tablet Take 1 tablet by mouth daily.     UNABLE TO FIND Med Name: Weight Control Tea     Zinc Gluconate 15 MG TABS Take 15 mg by mouth daily.     No current facility-administered medications for this visit.    Allergies as of 12/28/2023   (No Known Allergies)    Family History  Problem Relation Age of Onset   Hypertension Other    Diabetes Other    Cancer Neg Hx    Colon cancer Neg Hx    Esophageal cancer Neg Hx    Stomach cancer Neg Hx     Review of Systems:    Constitutional: No weight loss, fever, chills, weakness  or fatigue HEENT: Eyes: No change in vision               Ears, Nose, Throat:  No change in hearing or congestion Skin: No rash or itching Cardiovascular: No chest pain, chest pressure or palpitations   Respiratory: No SOB or cough Gastrointestinal: See HPI and otherwise negative Genitourinary: No dysuria or change in urinary frequency Neurological: No headache, dizziness or syncope Musculoskeletal: No new muscle or joint pain Hematologic: No bleeding or bruising Psychiatric: No history of depression or anxiety    Physical Exam:  Vital signs: BP 112/74   Pulse 80   Ht 4' 11 (1.499 m)   Wt 185 lb 8 oz (84.1 kg)   SpO2 96%   BMI 37.47 kg/m   Constitutional:   Pleasant  female appears to be in NAD, Well developed, Well nourished, alert and cooperative Throat: Oral cavity and pharynx without inflammation, swelling or lesion.  Respiratory: Respirations even and unlabored. Lungs clear to auscultation bilaterally.   No wheezes, crackles, or rhonchi.  Cardiovascular: Normal S1, S2. Regular rate and rhythm. No peripheral edema, cyanosis or pallor.  Gastrointestinal:  Soft, nondistended, no abd pain. No rebound or guarding. Normal bowel sounds. No appreciable masses or hepatomegaly. Rectal:  Not performed.  Msk:  Symmetrical without gross deformities. Without edema, no deformity or joint abnormality.  Neurologic:  Alert and  oriented x4;  grossly normal neurologically.  Skin:   Dry and intact without significant lesions or rashes. Psychiatric: Oriented to person, place and time. Demonstrates good judgement and reason without abnormal affect or behaviors.  RELEVANT LABS AND IMAGING: CBC    Latest Ref Rng & Units 12/18/2020   10:47 AM 11/19/2020    9:14 AM 09/27/2020   11:14 AM  CBC  WBC 3.4 - 10.8 x10E3/uL 15.2  11.7  11.5   Hemoglobin 11.1 - 15.9 g/dL 87.9  87.2  86.4   Hematocrit 34.0 - 46.6 % 34.9  36.7  40.7   Platelets 150 - 450 x10E3/uL 352  311  301      CMP     Latest  Ref Rng & Units 12/18/2020   10:47 AM 11/23/2020    7:54 AM 11/19/2020    9:14 AM  CMP  Glucose 70 - 99 mg/dL 869  844  869   BUN 8 - 27 mg/dL 25  24  31    Creatinine 0.57 - 1.00 mg/dL 8.99  8.99  8.85   Sodium 134 - 144 mmol/L 138  138  140   Potassium 3.5 - 5.2 mmol/L 5.0  4.1  5.3   Chloride 96 - 106 mmol/L 101  105  102   CO2 20 - 29 mmol/L 22  23  20    Calcium 8.7 -  10.3 mg/dL 9.8  9.4  89.9      Lab Results  Component Value Date   TSH 46.10 (A) 06/02/2023  11/27/2022 labs show: WBC 10.7, hemoglobin 12.6, platelets 314, A1c 7.4, BUN 23, creatinine 0.96, normal LFTs, TSH 5.110 06/02/2023 labs show: WBC 12.7, hemoglobin 12.9, platelets 326, BUN 29, creatinine 1.63, normal LFTs, total bili 0.4, A1c 8.5, TSH 46.100, T4 free direct 0.46  12/08/23 labs show: TSH 2.010 12/27/23 labs show: BUN 23, Creat 1.05, AST 13, ALT 11, alk phosp 89, wbc 16.2, hgb 12.8, plt 316 09/17/2020 echo-Left ventricular ejection fraction, by estimation, is 50 to 55%.   Imaging: 06/30/15 CT ABD/pelvis MPRESSION: Sigmoid diverticulitis. No drainable fluid collection/ abscess. No free air to suggest macroscopic perforation. Dilated common duct, measuring 19 mm, although smoothly tapering at the ampulla. By report, this measured 12 mm in 2001, indicating a chronic process. In the setting of normal LFTs, this is of questionable clinical significance.  No evidence of bowel obstruction.  Normal appendix. Additional ancillary findings as above 03/06/22 CT abd/pelvis w IV contrast 1.  There is fusiform dilatation of the extrahepatic bile duct, measuring up to 18 mm in diameter with lesser central intrahepatic biliary ductal dilatation. No obstructing lesion is evident. Recommend correlation with liver function tests to exclude obstructive physiology. If there is concern for choledocholithiasis, MRCP would be recommended.  2.  Small hiatal hernia. Above the hernia, the distal esophageal wall appears mildly thickened,  suggesting esophagitis.  3.  Colonic diverticulosis without evidence of diverticulitis.  03/06/22 US  abd limited IMPRESSION:  Enlarged gallbladder with intra and extrahepatic biliary ductal dilatation. Positive sonographic Murphy sign. Please correlate for possible cholecystitis.  03/07/2022, IR angiogram mesenteric FINDINGS: No active bleed identified. There is a normal variant of accessory right hepatic artery coming from the GDA which had to be excluded by the coil pack in order to adequately empirically treat for duodenal ulcer bleed. Celiac artery was patent on injection.  03/07/22 NM hepatobiliary 1. Normal gallbladder filling. No evidence of cystic duct obstruction to suggest acute cholecystitis.  2. There is no extension of radiotracer into the duodenum over the course of 60 minutes. (Examination was terminated at this point, as the patient was transferred to the [facility] lab.) Biliary obstruction cannot be excluded based upon findings of this exam.   GI procedures: 03/07/2022, EGD LA Grade C (one or more mucosal breaks continuous between tops of 2 or  more mucosal folds, less than 75% circumference) esophagitis with no  bleeding was found in the lower third of the esophagus. Clotted blood was found in the gastric body. One spurting cratered duodenal ulcer with spurting hemorrhage (Forrest Class Ia) was found in the duodenal bulb. The lesion was 10 mm in largest dimension. Area was successfully injected with 5 mL of a 0.1 mg/mL solution of epinephrine for hemostasis. To stop active bleeding, one hemostatic clip was successfully placed (MR conditional). Clip manufacturer: [employer]. To stop active bleeding, hemostatic spray was deployed. Several sprays were applied. There was no bleeding at the end of the procedure.   Assessment: Encounter Diagnoses  Name Primary?   Special screening for malignant neoplasms, colon Yes   Chronic idiopathic constipation    Gastroesophageal reflux disease  with esophagitis, unspecified whether hemorrhage       78 year old female patient that presents for follow-up on GERD, RUQ pain, and chronic constipation.  Patient has been doing well with high-fiber diet and senna tea.  In the past we have discussed prescription  medications however they would like to avoid for now.  Patient is due for colon screening colonoscopy and per patient's husband her last colonoscopy was over 10 years ago and normal.  Will go ahead and schedule colonoscopy with 2-day prep in [facility] with Dr. San. Patient also has history of GERD and gastric ulcers.  Reinforced no NSAIDs and patient continues PPI therapy p.o. daily.  Overall patient is doing very well.  Patient has had imaging over the course of the last 7 years or more showing CBD dilatation but unfortunately due to metal found when I referred her for MRCP she was unable to complete.  At this time patient is currently not having any symptoms would like to hold off on any further workup.  Patient's husband does mention she has been under a lot of stress which seems to exacerbate the symptoms. Hgb stable 12.8 baseline. No overt bleeding.    Plan: - Recommend rechecking CBC, declined lab work  -Continue high fiber diet  -Can continue tea with Senna po daily.  -Continue pantoprazole 40 mg po daily  -recommend stress management -recommend weight loss, activity as tolerated - Reinforced on No NSAID's -Recommended MRCP outpatient , unable to do due to unknown metal in body. Per records : 126/24 GDA embolization with coils. Hold off for now. -Schedule for a colonoscopy with 2 day prep in [facility] with Dr. San. The risks and benefits of colonoscopy with possible polypectomy / biopsies were discussed and the patient agrees to proceed.    Thank you for the courtesy of this consult. Please call me with any questions or concerns.   Dishon Kehoe, FNP-C [facility] 12/28/2023, 1:09 PM  Cc: Gerome Brunet, DO

## 2023-12-28 NOTE — Patient Instructions (Signed)
 You have been scheduled for an endoscopy and colonoscopy. Please follow the written instructions given to you at your visit today.  If you use inhalers (even only as needed), please bring them with you on the day of your procedure.  DO NOT TAKE 7 DAYS PRIOR TO TEST- Trulicity (dulaglutide) Ozempic, Wegovy (semaglutide) Mounjaro, Zepbound (tirzepatide) Bydureon Bcise (exanatide extended release)  DO NOT TAKE 1 DAY PRIOR TO YOUR TEST Rybelsus (semaglutide) Adlyxin (lixisenatide) Victoza (liraglutide) Byetta (exanatide) ___________________________________________________________________________  Due to recent changes in healthcare laws, you may see the results of your imaging and laboratory studies on MyChart before your provider has had a chance to review them.  We understand that in some cases there may be results that are confusing or concerning to you. Not all laboratory results come back in the same time frame and the provider may be waiting for multiple results in order to interpret others.  Please give us  48 hours in order for your provider to thoroughly review all the results before contacting the office for clarification of your results.   _______________________________________________________  If your blood pressure at your visit was 140/90 or greater, please contact your primary care physician to follow up on this.  _______________________________________________________  If you are age 18 or older, your body mass index should be between 23-30. Your Body mass index is 37.47 kg/m. If this is out of the aforementioned range listed, please consider follow up with your Primary Care Provider.  If you are age 69 or younger, your body mass index should be between 19-25. Your Body mass index is 37.47 kg/m. If this is out of the aformentioned range listed, please consider follow up with your Primary Care Provider.   ________________________________________________________  The  [facility] providers would like to encourage you to use MYCHART to communicate with providers for non-urgent requests or questions.  Due to long hold times on the telephone, sending your provider a message by [facility] may be a faster and more efficient way to get a response.  Please allow 48 business hours for a response.  Please remember that this is for non-urgent requests.  _______________________________________________________  [facility] is using a team-based approach to care.  Your team is made up of your doctor and two to three APPS. Our APPS (Nurse Practitioners and Physician Assistants) work with your physician to ensure care continuity for you. They are fully qualified to address your health concerns and develop a treatment plan. They communicate directly with your gastroenterologist to care for you. Seeing the Advanced Practice Practitioners on your physician's team can help you by facilitating care more promptly, often allowing for earlier appointments, access to diagnostic testing, procedures, and other specialty referrals.  ' Thank you for trusting me with your gastrointestinal care. Deanna May, FNP-C

## 2024-01-03 NOTE — Progress Notes (Signed)
 Agree with the assessment and plan as outlined by [facility], FNP-C.  Carlitos Bottino, DO, [facility]

## 2024-01-06 DIAGNOSIS — L304 Erythema intertrigo: Secondary | ICD-10-CM | POA: Diagnosis not present

## 2024-01-06 DIAGNOSIS — L820 Inflamed seborrheic keratosis: Secondary | ICD-10-CM | POA: Diagnosis not present

## 2024-02-12 ENCOUNTER — Ambulatory Visit: Admitting: Gastroenterology

## 2024-02-12 ENCOUNTER — Encounter: Payer: Self-pay | Admitting: Gastroenterology

## 2024-02-12 VITALS — BP 107/59 | HR 61 | Temp 97.0°F | Resp 13 | Ht 59.0 in | Wt 185.8 lb

## 2024-02-12 DIAGNOSIS — K635 Polyp of colon: Secondary | ICD-10-CM

## 2024-02-12 DIAGNOSIS — Z1211 Encounter for screening for malignant neoplasm of colon: Secondary | ICD-10-CM

## 2024-02-12 DIAGNOSIS — K648 Other hemorrhoids: Secondary | ICD-10-CM | POA: Diagnosis not present

## 2024-02-12 DIAGNOSIS — K5730 Diverticulosis of large intestine without perforation or abscess without bleeding: Secondary | ICD-10-CM | POA: Diagnosis not present

## 2024-02-12 DIAGNOSIS — K640 First degree hemorrhoids: Secondary | ICD-10-CM

## 2024-02-12 DIAGNOSIS — D122 Benign neoplasm of ascending colon: Secondary | ICD-10-CM

## 2024-02-12 MED ORDER — SODIUM CHLORIDE 0.9 % IV SOLN: 500.0000 mL | INTRAVENOUS | Status: DC

## 2024-02-12 NOTE — Patient Instructions (Signed)

## 2024-02-12 NOTE — Progress Notes (Signed)
 Report to PACU, RN, vss, BBS= Clear.

## 2024-02-12 NOTE — Progress Notes (Signed)
 Called to room to assist during endoscopic procedure.  Patient ID and intended procedure confirmed with present staff. Received instructions for my participation in the procedure from the performing physician.

## 2024-02-12 NOTE — Op Note (Signed)
 [facility] Patient Name: Kimberly Bullock Procedure Date: 02/12/2024 9:57 AM MRN: [id number] Endoscopist: Sandor Flatter , MD, [id number] Age: 79 Referring MD:  Date of Birth: 1946-02-09 Gender: Female Account #: [id number] Procedure:                Colonoscopy Indications:              Screening for colorectal malignant neoplasm (last                            colonoscopy was more than 10 years ago) Medicines:                Monitored Anesthesia Care Procedure:                Pre-Anesthesia Assessment:                           - Prior to the procedure, a History and Physical                            was performed, and patient medications and                            allergies were reviewed. The patient's tolerance of                            previous anesthesia was also reviewed. The risks                            and benefits of the procedure and the sedation                            options and risks were discussed with the patient.                            All questions were answered, and informed consent                            was obtained. Prior Anticoagulants: The patient has                            taken no anticoagulant or antiplatelet agents. ASA                            Grade Assessment: III - A patient with severe                            systemic disease. After reviewing the risks and                            benefits, the patient was deemed in satisfactory                            condition to undergo the procedure.  After obtaining informed consent, the colonoscope                            was passed under direct vision. Throughout the                            procedure, the patient's blood pressure, pulse, and                            oxygen saturations were monitored continuously. The                            Olympus Scope SN [id number] was introduced through the                            anus  and advanced to the the terminal ileum. The                            terminal ileum, ileocecal valve, appendiceal                            orifice, and rectum were photographed. Scope In: 11:04:51 AM Scope Out: 11:18:18 AM Scope Withdrawal Time: 0 hours 9 minutes 39 seconds  Total Procedure Duration: 0 hours 13 minutes 27 seconds  Findings:                 The perianal and digital rectal examinations were                            normal.                           A 3 mm polyp was found in the ascending colon. The                            polyp was sessile. The polyp was removed with a                            cold snare. Resection and retrieval were complete.                            Estimated blood loss was minimal.                           Many medium-mouthed and small-mouthed diverticula                            were found in the entire colon.                           Non-bleeding internal hemorrhoids were found during                            retroflexion. The hemorrhoids were small.  The terminal ileum appeared normal. Complications:            No immediate complications. Estimated Blood Loss:     Estimated blood loss was minimal. Impression:               - One 3 mm polyp in the ascending colon, removed                            with a cold snare. Resected and retrieved.                           - Diverticulosis in the entire examined colon.                           - Non-bleeding internal hemorrhoids.                           - The examined portion of the ileum was normal. Recommendation:           - Patient has a contact number available for                            emergencies. The signs and symptoms of potential                            delayed complications were discussed with the                            patient. Return to normal activities tomorrow.                            Written discharge instructions were provided to  the                            patient.                           - Resume previous diet.                           - Continue present medications.                           - Await pathology results.                           - Return to GI clinic PRN. Sandor Flatter, MD 02/12/2024 11:28:30 AM

## 2024-02-12 NOTE — Progress Notes (Signed)
 Kimberly Bullock ID [id number]- [employer] used

## 2024-02-12 NOTE — Progress Notes (Signed)
 "   GASTROENTEROLOGY PROCEDURE H&P NOTE   Primary Care Physician: Gerome Brunet, DO    Reason for Procedure:  Colon Cancer screening  Plan:    Colonoscopy  Patient is appropriate for endoscopic procedure(s) in the ambulatory ([facility]) setting.  The nature of the procedure, as well as the risks, benefits, and alternatives were carefully and thoroughly reviewed with the patient. Ample time for discussion and questions allowed. The patient understood, was satisfied, and agreed to proceed. I personally addressed all patient questions and concerns.     HPI: Kimberly Bullock is a 79 y.o. female who presents for colonoscopy for routine Colon Cancer screening.  No active GI symptoms.  No known family history of colon cancer or related malignancy.  Patient is otherwise without complaints or active issues today.  Last colonoscopy was 10+ years ago in [place] and reportedly normal.   Past Medical History:  Diagnosis Date   Anal fissure    Diabetes mellitus without complication (HCC)    Elevated cholesterol    Hypertension    Thyroid  disease     Past Surgical History:  Procedure Laterality Date   ANAL FISTULOTOMY  1968   COLONOSCOPY     CORONARY ARTERY BYPASS GRAFT  2017   Done in [place]   FIXATION KYPHOPLASTY LUMBAR SPINE  2015   LEFT HEART CATH AND CORS/GRAFTS ANGIOGRAPHY N/A 11/23/2020   Procedure: LEFT HEART CATH AND CORS/GRAFTS ANGIOGRAPHY;  Surgeon: Jordan, Peter M, MD;  Location: [facility];  Service: Cardiovascular;  Laterality: N/A;    Prior to Admission medications  Medication Sig Start Date End Date Taking? Authorizing Provider  levothyroxine (SYNTHROID) 100 MCG tablet Take 100 mcg by mouth every other day.   Yes [provider]  levothyroxine (SYNTHROID) 75 MCG tablet Take 75 mcg by mouth every other day.   Yes [provider]  metFORMIN  (GLUCOPHAGE ) 850 MG tablet Take 1 tablet (850 mg total) by mouth 2 (two) times daily with a meal. 11/26/20   Yes Jordan, Peter M, MD  ranolazine  (RANEXA ) 500 MG 12 hr tablet Take 1 tablet (500 mg total) by mouth 2 (two) times daily. Patient taking differently: Take 500 mg by mouth daily. 09/27/20  Yes Tobb, Kardie, DO  telmisartan-hydrochlorothiazide (MICARDIS HCT) 80-12.5 MG tablet Take 1 tablet by mouth daily.   Yes [provider]  Flaxseed, Linseed, (FLAX SEEDS PO) Take by mouth.    [provider]  magnesium oxide (MAG-OX) 400 MG tablet Take 400 mg by mouth daily.    [provider]  pantoprazole (PROTONIX) 40 MG tablet Take 40 mg by mouth 2 (two) times daily. Patient not taking: Reported on 02/12/2024 03/10/22   [provider]  rosuvastatin (CRESTOR) 10 MG tablet Take 10 mg by mouth daily. Patient not taking: Reported on 02/12/2024    [provider]  UNABLE TO FIND Med Name: Weight Control Tea    [provider]  Zinc Gluconate 15 MG TABS Take 15 mg by mouth daily. Patient not taking: Reported on 02/12/2024    [provider]    Current Outpatient Medications  Medication Sig Dispense Refill   levothyroxine (SYNTHROID) 100 MCG tablet Take 100 mcg by mouth every other day.     levothyroxine (SYNTHROID) 75 MCG tablet Take 75 mcg by mouth every other day.     metFORMIN  (GLUCOPHAGE ) 850 MG tablet Take 1 tablet (850 mg total) by mouth 2 (two) times daily with a meal.     ranolazine  (RANEXA ) 500  MG 12 hr tablet Take 1 tablet (500 mg total) by mouth 2 (two) times daily. (Patient taking differently: Take 500 mg by mouth daily.) 180 tablet 3   telmisartan-hydrochlorothiazide (MICARDIS HCT) 80-12.5 MG tablet Take 1 tablet by mouth daily.     Flaxseed, Linseed, (FLAX SEEDS PO) Take by mouth.     magnesium oxide (MAG-OX) 400 MG tablet Take 400 mg by mouth daily.     pantoprazole (PROTONIX) 40 MG tablet Take 40 mg by mouth 2 (two) times daily. (Patient not taking: Reported on 02/12/2024)     rosuvastatin (CRESTOR) 10 MG tablet Take 10 mg by mouth  daily. (Patient not taking: Reported on 02/12/2024)     UNABLE TO FIND Med Name: Weight Control Tea     Zinc Gluconate 15 MG TABS Take 15 mg by mouth daily. (Patient not taking: Reported on 02/12/2024)     Current Facility-Administered Medications  Medication Dose Route Frequency Provider Last Rate Last Admin   0.9 %  sodium chloride  infusion  500 mL Intravenous Continuous Ryian Lynde V, DO        Allergies as of 02/12/2024   (No Known Allergies)    Family History  Problem Relation Age of Onset   Hypertension Other    Diabetes Other    Cancer Neg Hx    Colon cancer Neg Hx    Esophageal cancer Neg Hx    Stomach cancer Neg Hx    Rectal cancer Neg Hx     Social History   Socioeconomic History   Marital status: Married    Spouse name: Not on file   Number of children: 2   Years of education: Not on file   Highest education level: Not on file  Occupational History   Occupation: Retired  Tobacco Use   Smoking status: Never   Smokeless tobacco: Never  Vaping Use   Vaping status: Never Used  Substance and Sexual Activity   Alcohol use: No    Alcohol/week: 0.0 standard drinks of alcohol   Drug use: No   Sexual activity: Not on file  Other Topics Concern   Not on file  Social History Narrative   Not on file   Social Drivers of Health   Tobacco Use: Low Risk (02/12/2024)   Patient History    Smoking Tobacco Use: Never    Smokeless Tobacco Use: Never    Passive Exposure: Not on file  Financial Resource Strain: Not on file  Food Insecurity: Not on file  Transportation Needs: Not on file  Physical Activity: Not on file  Stress: Not on file  Social Connections: Not on file  Intimate Partner Violence: Not on file  Depression (EYV7-0): Not on file  Alcohol Screen: Not on file  Housing: Not on file  Utilities: Not on file  Health Literacy: Not on file    Physical Exam: Vital signs in last 24 hours: @BP  114/67   Pulse 78   Temp (!) 97 F (36.1 C) (Skin)   Ht 4'  11 (1.499 m)   Wt 185 lb 12.8 oz (84.3 kg)   SpO2 95%   BMI 37.53 kg/m  GEN: NAD EYE: Sclerae anicteric ENT: MMM CV: Non-tachycardic Pulm: CTA b/l GI: Soft, NT/ND NEURO:  Alert & Oriented x 3   Sandor Flatter, DO [facility]   02/12/2024 10:52 AM  "

## 2024-02-15 ENCOUNTER — Telehealth: Payer: Self-pay

## 2024-02-15 NOTE — Telephone Encounter (Signed)
 Attempted to call patient using [employer] for post-procedure f/u call. No answer. Unable to leave message as mailbox is full.

## 2024-02-17 LAB — SURGICAL PATHOLOGY

## 2024-02-18 ENCOUNTER — Ambulatory Visit: Payer: Self-pay | Admitting: Gastroenterology
